# Patient Record
Sex: Female | Born: 1985 | Race: Black or African American | Hispanic: No | Marital: Single | Smoking: Never smoker
Health system: Southern US, Community
[De-identification: ages and names within clinical notes are randomized; demographics above are authoritative.]

## PROBLEM LIST (undated history)

## (undated) HISTORY — PX: TUBAL LIGATION: SHX77

---
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
## 2004-05-17 ENCOUNTER — Emergency Department: Payer: Self-pay | Admitting: Emergency Medicine

## 2005-05-10 ENCOUNTER — Emergency Department: Payer: Self-pay | Admitting: Emergency Medicine

## 2005-09-10 ENCOUNTER — Ambulatory Visit: Payer: Self-pay | Admitting: General Practice

## 2005-09-10 ENCOUNTER — Emergency Department: Payer: Self-pay | Admitting: General Practice

## 2005-11-09 ENCOUNTER — Emergency Department: Payer: Self-pay | Admitting: Emergency Medicine

## 2006-01-30 ENCOUNTER — Other Ambulatory Visit: Payer: Self-pay

## 2006-01-30 ENCOUNTER — Emergency Department: Payer: Self-pay | Admitting: Emergency Medicine

## 2006-03-11 ENCOUNTER — Observation Stay: Payer: Self-pay

## 2006-04-25 ENCOUNTER — Observation Stay: Payer: Self-pay | Admitting: Obstetrics and Gynecology

## 2006-04-28 ENCOUNTER — Inpatient Hospital Stay: Payer: Self-pay

## 2006-10-06 ENCOUNTER — Emergency Department: Payer: Self-pay | Admitting: Emergency Medicine

## 2008-01-05 IMAGING — US US OB < 14 WEEKS - US OB TV
1 series · 17 of 28 positions shown · non-contrast
Comparison: none

REASON FOR EXAM: pregnant w/llq pain
COMMENTS:

[Series 1: us ob < 14 weeks - us ob tv · 17 of 39 slices shown]
[im 1/39]
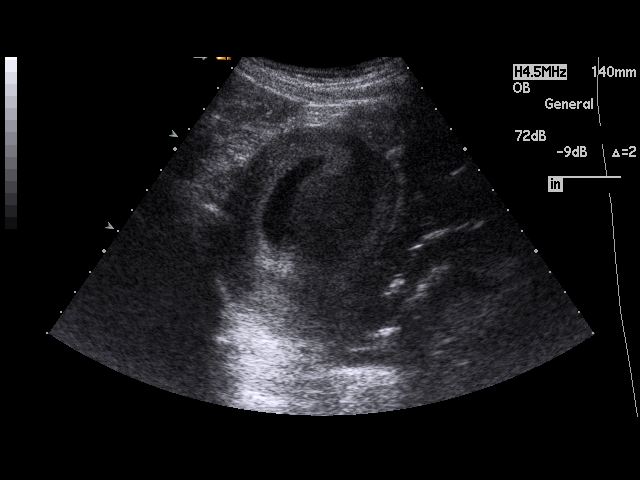
[im 3/39]
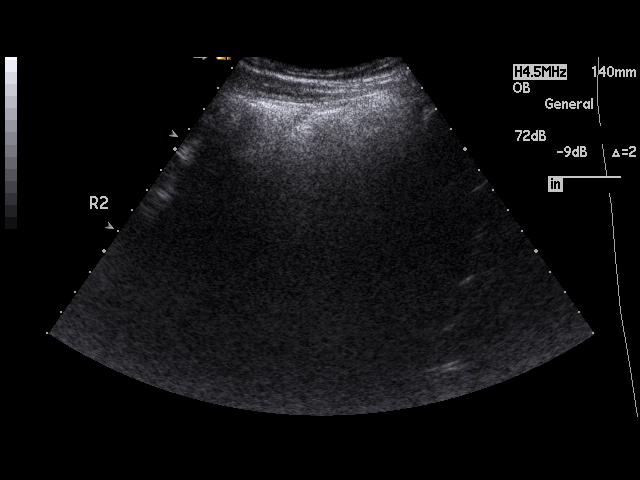
[im 6/39]
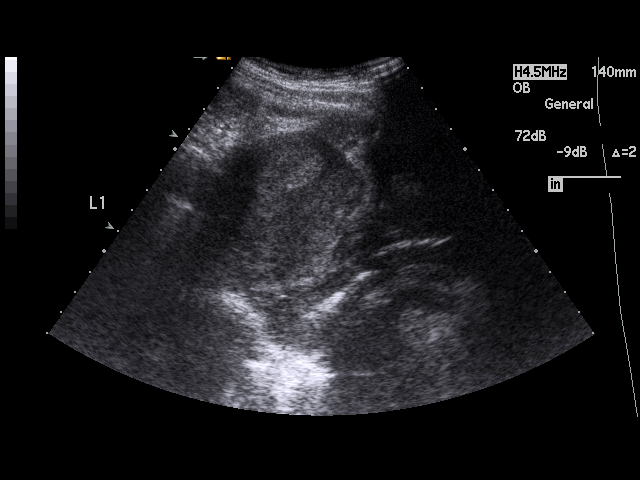
[im 8/39]
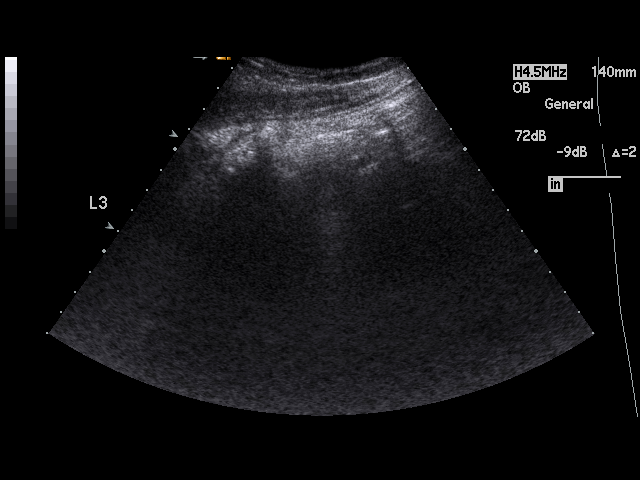
[im 10/39]
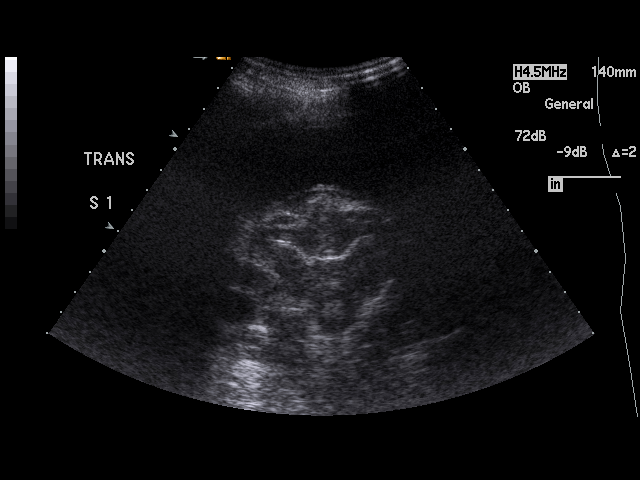
[im 13/39]
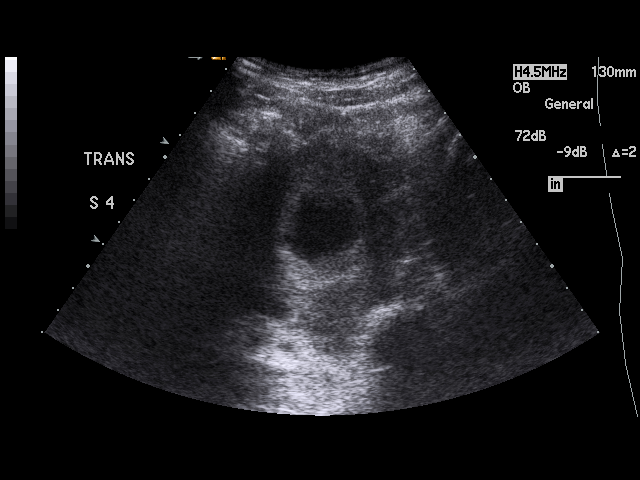
[im 15/39]
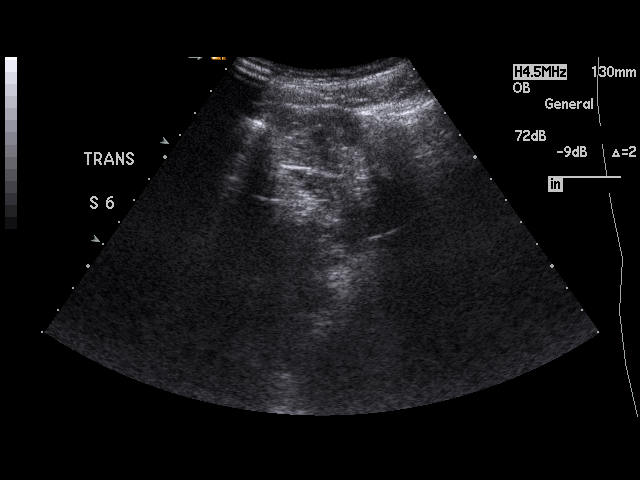
[im 17/39]
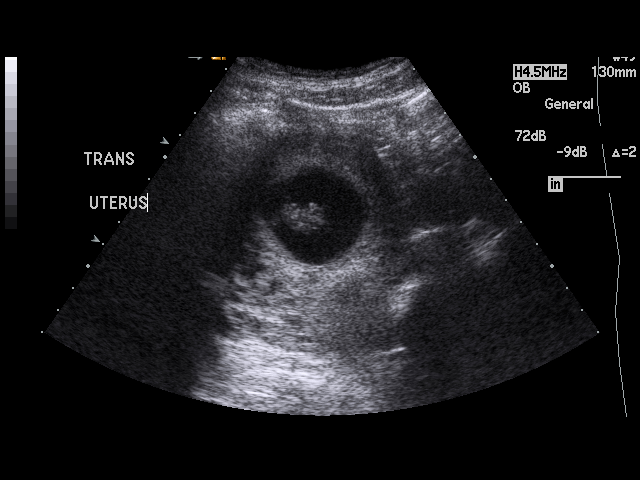
[im 20/39]
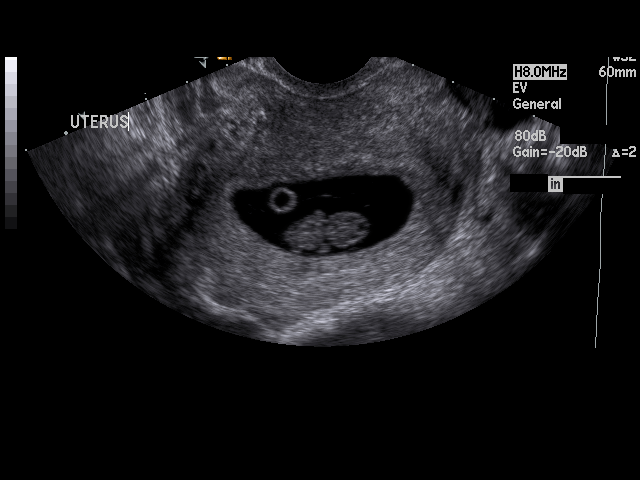
[im 22/39]
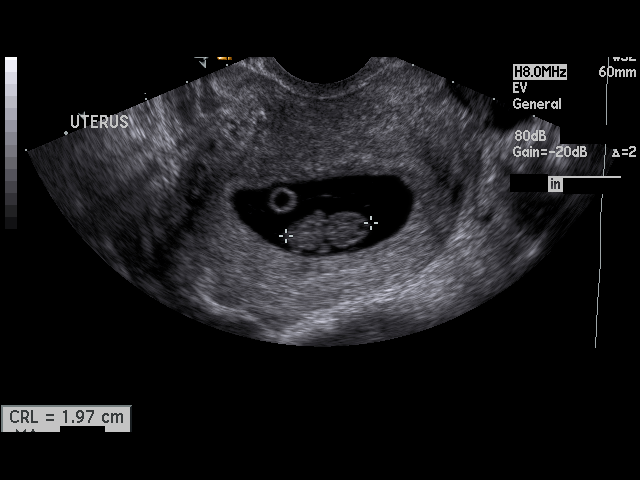
[im 24/39]
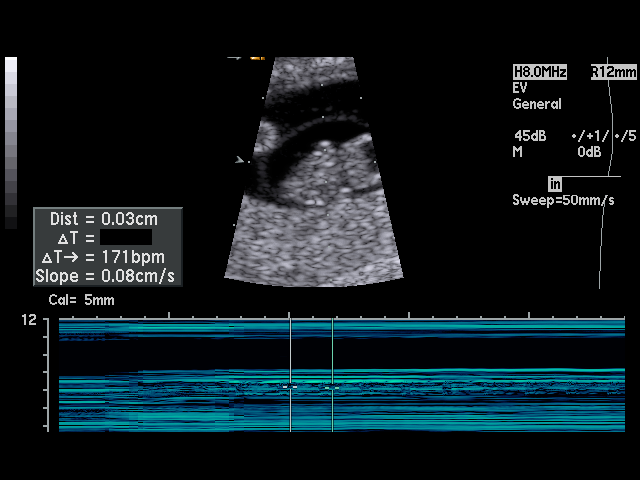
[im 26/39]
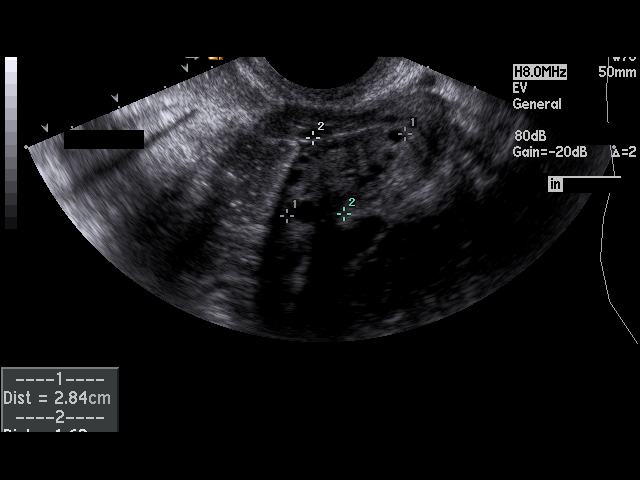
[im 29/39]
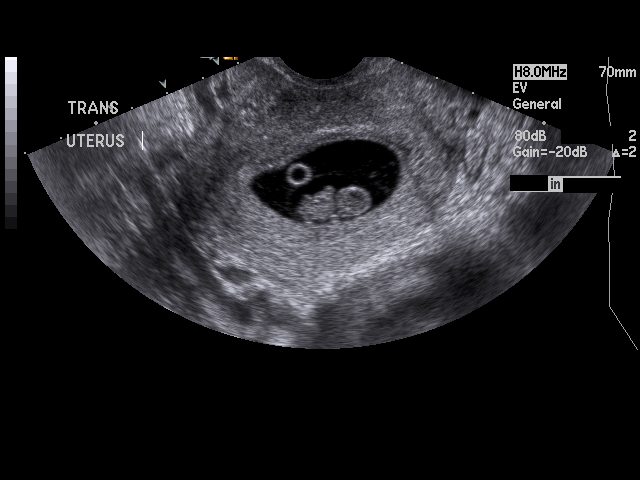
[im 31/39]
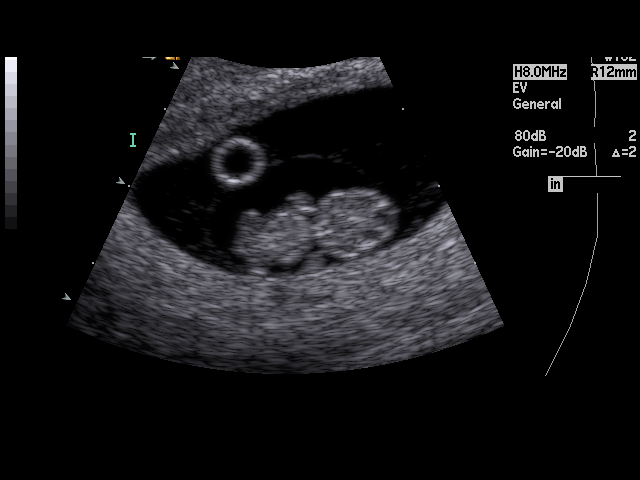
[im 33/39]
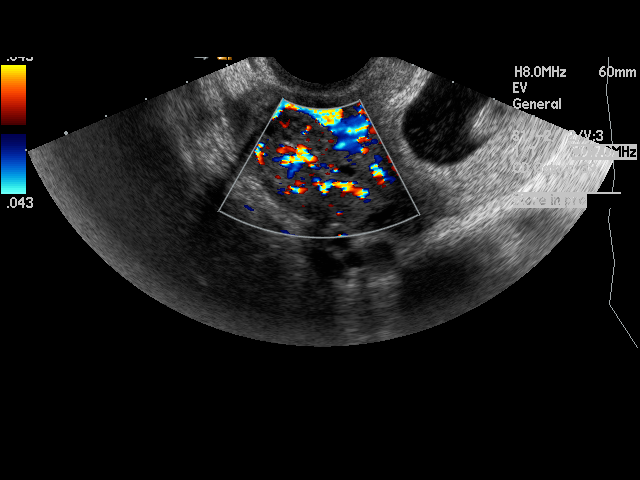
[im 36/39]
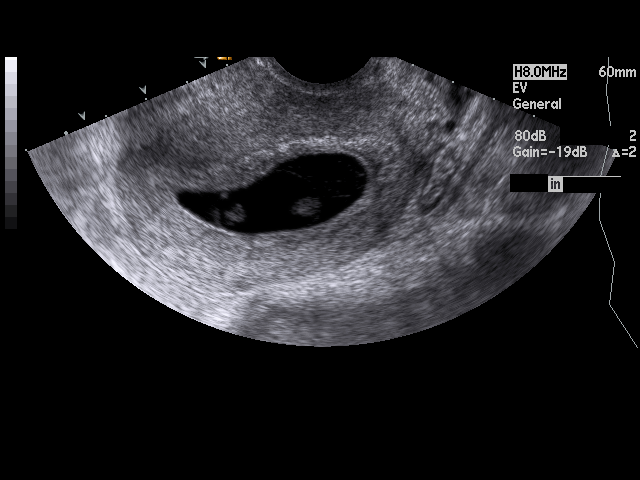
[im 39/39]
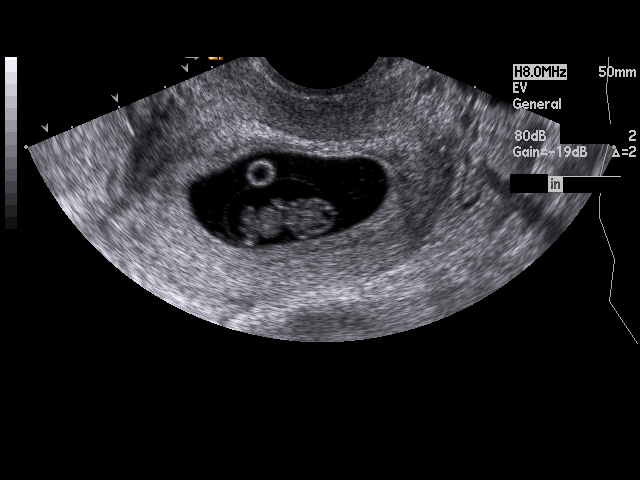

[17 of 28 positions shown; findings below may reference images not displayed]

PROCEDURE:     US  - US OB LESS THAN 14 WEEKS  - September 10, 2005 [DATE]

RESULT:          The patient is known to be in the first trimester of
pregnancy and has had several weeks of LEFT lower quadrant discomfort.

There is a viable intrauterine pregnancy with floating presentation.
Cardiac activity at the rate of 171 beats per minute was seen.  The placenta
is circumferential with no evidence of abruption.  The crown-rump length
measures an average of 1.96 cm, corresponding to an 8-weeks, 4 days
gestation + / - 5 days.  The estimated date of delivery is 18 April, 2006.

The maternal ovaries are normal in appearance.  There is no free fluid in
the cul-de-sac.
IMPRESSION: 1.     There is a viable intrauterine pregnancy of approximately 8 weeks, 4
days, corresponding to an estimated date of confinement 18 April, 2006.
2.     There is no evidence of placental abruption or subchorionic
hemorrhage.
3.     No maternal adnexal abnormalities are seen.

The findings were called to the emergency room at the conclusion of the
study.

## 2014-03-20 HISTORY — PX: WISDOM TOOTH EXTRACTION: SHX21

## 2019-04-22 ENCOUNTER — Emergency Department
Admission: EM | Admit: 2019-04-22 | Discharge: 2019-04-22 | Disposition: A | Discharge status: Home / Self Care | Payer: Managed Care, Other (non HMO) | Attending: Emergency Medicine | Admitting: Emergency Medicine

## 2019-04-22 ENCOUNTER — Emergency Department: Payer: Managed Care, Other (non HMO)

## 2019-04-22 ENCOUNTER — Other Ambulatory Visit: Payer: Self-pay

## 2019-04-22 ENCOUNTER — Encounter: Payer: Self-pay | Admitting: Emergency Medicine

## 2019-04-22 DIAGNOSIS — N939 Abnormal uterine and vaginal bleeding, unspecified: Secondary | ICD-10-CM | POA: Insufficient documentation

## 2019-04-22 DIAGNOSIS — N938 Other specified abnormal uterine and vaginal bleeding: Secondary | ICD-10-CM

## 2019-04-22 LAB — CBC
HCT: 42.6 % (ref 36.0–46.0)
Hemoglobin: 13.7 g/dL (ref 12.0–15.0)
MCH: 28.2 pg (ref 26.0–34.0)
MCHC: 32.2 g/dL (ref 30.0–36.0)
MCV: 87.7 fL (ref 80.0–100.0)
Platelets: 364 10*3/uL (ref 150–400)
RBC: 4.86 MIL/uL (ref 3.87–5.11)
RDW: 13.4 % (ref 11.5–15.5)
WBC: 9.4 10*3/uL (ref 4.0–10.5)
nRBC: 0 % (ref 0.0–0.2)

## 2019-04-22 LAB — BASIC METABOLIC PANEL
Anion gap: 9 (ref 5–15)
BUN: 12 mg/dL (ref 6–20)
CO2: 25 mmol/L (ref 22–32)
Calcium: 9.2 mg/dL (ref 8.9–10.3)
Chloride: 105 mmol/L (ref 98–111)
Creatinine, Ser: 0.85 mg/dL (ref 0.44–1.00)
GFR calc Af Amer: >60 mL/min (ref >60)
GFR calc non Af Amer: >60 mL/min (ref >60)
Glucose, Bld: 104 mg/dL — ABNORMAL HIGH (ref 70–99)
Potassium: 3.7 mmol/L (ref 3.5–5.1)
Sodium: 139 mmol/L (ref 135–145)

## 2019-04-22 LAB — POCT PREGNANCY, URINE: Preg Test, Ur: NEGATIVE

## 2019-04-22 NOTE — ED Notes (Signed)
Pt reports waking up in a puddle of blood this am.  No abd pain or cramping.  No urinary sx.  Last menses 10 days ago.  Pt reports having a tubal ligation.   Pt states she is on her 4th pad since 6am today.  Pt alert  Speech clear.  Pt ambulates without diff.

## 2019-04-22 NOTE — ED Triage Notes (Addendum)
Pt here for vaginal bleeding.  Denies any pain.  Reports had her period 04/07/19.  Tubes are tied.  4 pads today.  Ambulatory. NAD.  No other symptoms other than bleeding.  Period like clock work per pt.  Reports waking up in puddle of blood this AM.

## 2019-04-22 NOTE — ED Provider Notes (Signed)
[facility] Emergency Department Provider Note  Time seen: 3:56 PM  I have reviewed the triage vital signs and the nursing notes.   HISTORY  Chief Complaint Vaginal Bleeding   HPI Nichole Bowen is a 34 y.o. female with no significant past medical history presents to the emergency department for vaginal bleeding.  According to the patient she had a menstrual period roughly 2 weeks ago and has since restarted bleeding.  Patient states she has never had breakthrough bleeding previously.  Patient was concerned because in 2017 they found abnormal cervical cells requiring a colposcopy which ultimately ruled out to be negative per patient.  Patient was also told at the time she had uterine fibroids but is never had breakthrough bleeding.  Patient is not on any birth control but had a tubal ligation in 2008.  Denies any lightheadedness or dizziness.   History reviewed. No pertinent past medical history.  There are no problems to display for this patient.   Past Surgical History:  Procedure Laterality Date  . TUBAL LIGATION      Prior to Admission medications   Not on File    No Known Allergies  History reviewed. No pertinent family history.  Social History Social History   Tobacco Use  . Smoking status: Never Smoker  . Smokeless tobacco: Never Used  Substance Use Topics  . Alcohol use: Yes  . Drug use: Never    Review of Systems Constitutional: Negative for fever. Cardiovascular: Negative for chest pain. Respiratory: Negative for shortness of breath. Gastrointestinal: Negative for abdominal pain Genitourinary: Negative for urinary compaints.  Positive for vaginal bleeding. Musculoskeletal: Negative for musculoskeletal complaints Neurological: Negative for headache All other ROS negative  ____________________________________________   PHYSICAL EXAM:  VITAL SIGNS: ED Triage Vitals [04/22/19 1502]  Enc Vitals Group     BP 119/83   Pulse Rate 73     Resp 16     Temp 98.6 F (37 C)     Temp Source Oral     SpO2 98 %     Weight 154 lb (69.9 kg)     Height 5\' 4"  (1.626 m)     Head Circumference      Peak Flow      Pain Score 0     Pain Loc      Pain Edu?      Excl. in [place]?     Constitutional: Alert and oriented. Well appearing and in no distress. Eyes: Normal exam ENT      Head: Normocephalic and atraumatic.      Mouth/Throat: Mucous membranes are moist. Cardiovascular: Normal rate, regular rhythm. Respiratory: Normal respiratory effort without tachypnea nor retractions. Breath sounds are clear  Gastrointestinal: Soft and nontender. No distention.  Benign abdominal exam. Musculoskeletal: Nontender with normal range of motion in all extremities.  Neurologic:  Normal speech and language. No gross focal neurologic deficits  Skin:  Skin is warm, dry and intact.  Psychiatric: Mood and affect are normal.   ____________________________________________    RADIOLOGY  Normal-appearing uterus and adnexa.  ____________________________________________   INITIAL IMPRESSION / ASSESSMENT AND PLAN / ED COURSE  Pertinent labs & imaging results that were available during my care of the patient were reviewed by me and considered in my medical decision making (see chart for details).   Patient presents emergency department for vaginal bleeding.  Differential would include dysfunctional uterine bleeding.  Pregnancy test is negative.  Lab work is thus far reassuring including normal H&H.  We will proceed with a pelvic ultrasound to further evaluate.  Patient agreeable to plan of care.  Patient does follow-up with an OB in [place].  Ultrasound is normal.  We will discharge the patient home with OB/GYN follow-up.  Patient agreeable to plan of care.  Nichole Bowen was evaluated in Emergency Department on 04/22/2019 for the symptoms described in the history of present illness. She was evaluated in the context of the global  COVID-19 pandemic, which necessitated consideration that the patient might be at risk for infection with the SARS-CoV-2 virus that causes COVID-19. Institutional protocols and algorithms that pertain to the evaluation of patients at risk for COVID-19 are in a state of rapid change based on information released by regulatory bodies including the CDC and federal and state organizations. These policies and algorithms were followed during the patient's care in the ED.  ____________________________________________   FINAL CLINICAL IMPRESSION(S) / ED DIAGNOSES  Dysfunctional uterine bleeding   Harvest Dark, MD 04/22/19 [id number]

## 2020-03-20 DIAGNOSIS — A599 Trichomoniasis, unspecified: Secondary | ICD-10-CM

## 2020-03-20 HISTORY — DX: Trichomoniasis, unspecified: A59.9

## 2020-08-04 ENCOUNTER — Ambulatory Visit: Payer: Self-pay | Admitting: Physician Assistant

## 2020-08-04 ENCOUNTER — Other Ambulatory Visit: Payer: Self-pay

## 2020-08-04 DIAGNOSIS — Z113 Encounter for screening for infections with a predominantly sexual mode of transmission: Secondary | ICD-10-CM

## 2020-08-04 DIAGNOSIS — A5901 Trichomonal vulvovaginitis: Secondary | ICD-10-CM

## 2020-08-04 LAB — WET PREP FOR TRICH, YEAST, CLUE
Trichomonas Exam: POSITIVE — AB
Yeast Exam: NEGATIVE

## 2020-08-04 MED ORDER — METRONIDAZOLE 500 MG PO TABS: 500.0000 mg | ORAL_TABLET | Freq: Two times a day (BID) | ORAL | 0 refills | Status: AC

## 2020-08-04 NOTE — Progress Notes (Signed)
Wet mount reviewed by provider, pt treated for trich per provider orders. Provider orders completed.

## 2020-08-05 ENCOUNTER — Encounter: Payer: Self-pay | Admitting: Physician Assistant

## 2020-08-05 NOTE — Progress Notes (Signed)
[facility] Department STI clinic/screening visit  Subjective:  Nichole Bowen is a 35 y.o. female being seen today for an STI screening visit. The patient reports they do have symptoms.  Patient reports that they do not desire a pregnancy in the next year.   They reported they are not interested in discussing contraception today.  No LMP recorded.   Patient has the following medical conditions:  There are no problems to display for this patient.   Chief Complaint  Patient presents with  . SEXUALLY TRANSMITTED DISEASE    screening     HPI  Patient reports that it "feels different" in her vaginal area for a few days a couple of weeks ago.  States symptoms have resolved but she would like a screening today.  Reports last HIV test was in 2021 and last pap was 18 months ago.  States LMP was 07/11/2020 and has had a BTL for BCM.   See flowsheet for further details and programmatic requirements.    The following portions of the patient's history were reviewed and updated as appropriate: allergies, current medications, past medical history, past social history, past surgical history and problem list.  Objective:  There were no vitals filed for this visit.  Physical Exam Constitutional:      General: She is not in acute distress.    Appearance: Normal appearance.  HENT:     Head: Normocephalic and atraumatic.     Comments: No nits,lice, or hair loss. No cervical, supraclavicular or axillary adenopathy.    Mouth/Throat:     Mouth: Mucous membranes are moist.     Pharynx: Oropharynx is clear. No oropharyngeal exudate or posterior oropharyngeal erythema.  Eyes:     Conjunctiva/sclera: Conjunctivae normal.  Pulmonary:     Effort: Pulmonary effort is normal.  Abdominal:     Palpations: Abdomen is soft. There is no mass.     Tenderness: There is no abdominal tenderness. There is no guarding or rebound.  Genitourinary:    General: Normal vulva.     Rectum: Normal.      Comments: External genitalia/pubic area without nits, lice, edema, erythema, lesions and inguinal adenopathy. Vagina with normal mucosa andmoderate amount of thick, yellow/green discharge. Cervix without visible lesions. Uterus firm, mobile, nt, no masses, no CMT, no adnexal tenderness or fullness. Musculoskeletal:     Cervical back: Neck supple. No tenderness.  Skin:    General: Skin is warm and dry.     Findings: No bruising, erythema, lesion or rash.  Neurological:     Mental Status: She is alert and oriented to person, place, and time.  Psychiatric:        Mood and Affect: Mood normal.        Behavior: Behavior normal.        Thought Content: Thought content normal.        Judgment: Judgment normal.      Assessment and Plan:  Nichole Bowen is a 35 y.o. female presenting to the [facility] Department for STI screening  1. Screening for STD (sexually transmitted disease) Patient into clinic with symptoms. Rec condoms with all sex. Await test results.  Counseled that RN will call if needs to RTC for treatment once results are back. - WET PREP FOR [place], YEAST, CLUE - Chlamydia/Gonorrhea [place] [facility] - HIV [place] [facility] - Syphilis Serology, [facility]  2. Trichomonal vulvovaginitis Treat for Trich with Metronidazole 500 mg #14 1 po BID for 7  days with food, no EtOH for 24 hr before and until 72 hr after completing medicine. No sex for 14 days and until after partner completes treatment. Enc to use OTC antifungal cream if has itching during or just after treatment with antibiotic. - metroNIDAZOLE (FLAGYL) 500 MG tablet; Take 1 tablet (500 mg total) by mouth 2 (two) times daily for 7 days.  Dispense: 14 tablet; Refill: 0     Return in about 3 months (around 11/04/2020) for TOC.  No future appointments.  Nichole Dilling, PA

## 2020-08-11 LAB — HM HIV SCREENING LAB: HM HIV Screening: NEGATIVE

## 2021-05-05 ENCOUNTER — Ambulatory Visit: Payer: Managed Care, Other (non HMO)

## 2021-08-16 IMAGING — US US PELVIS COMPLETE WITH TRANSVAGINAL
1 series · 14 of 25 positions shown · non-contrast
Comparison: None

CLINICAL DATA: Bleeding



[Series 1: us pelvis complete with transvaginal · 14 of 101 slices shown]
[im 1/101]
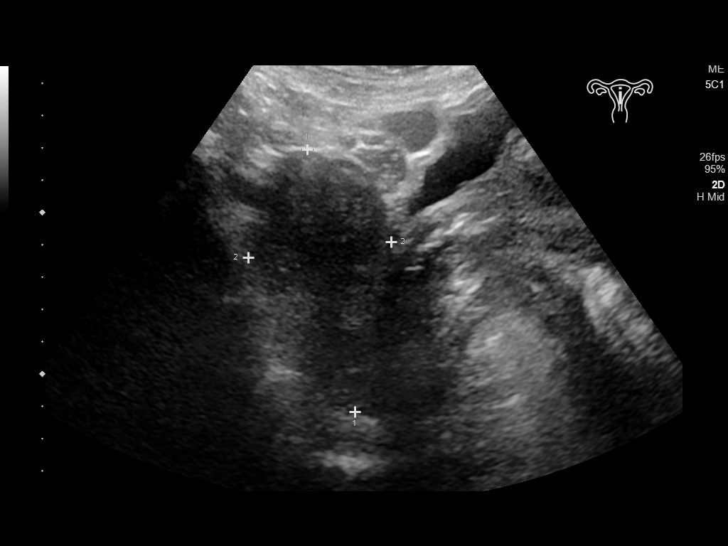
[im 9/101]
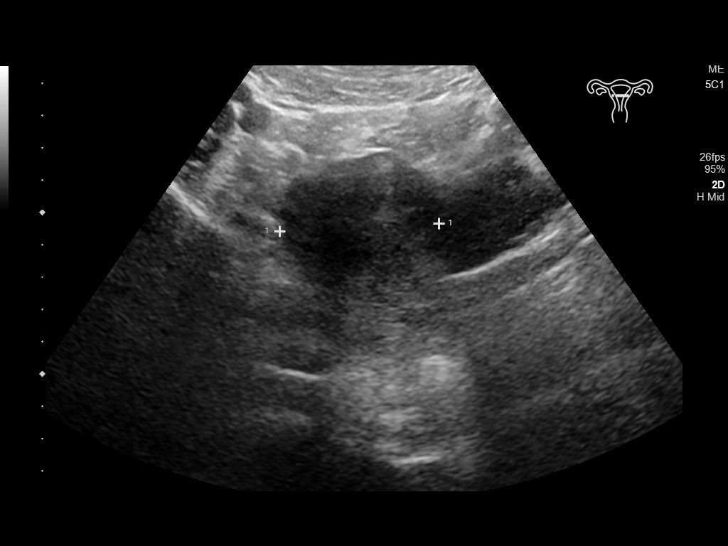
[im 17/101]
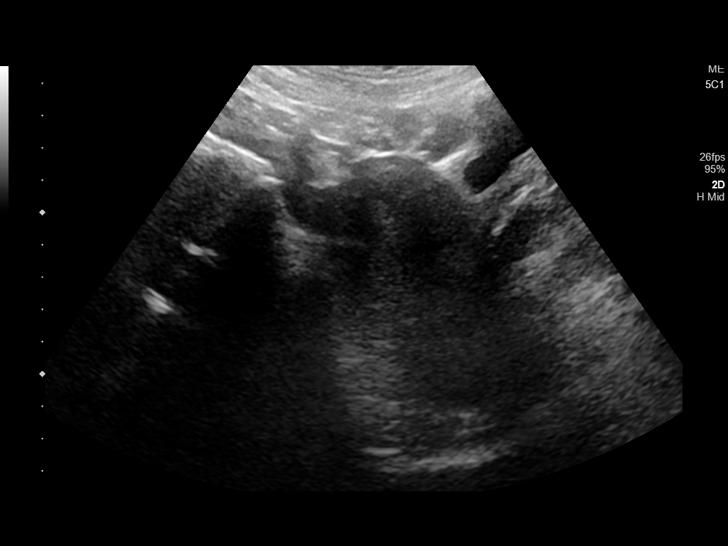
[im 26/101]
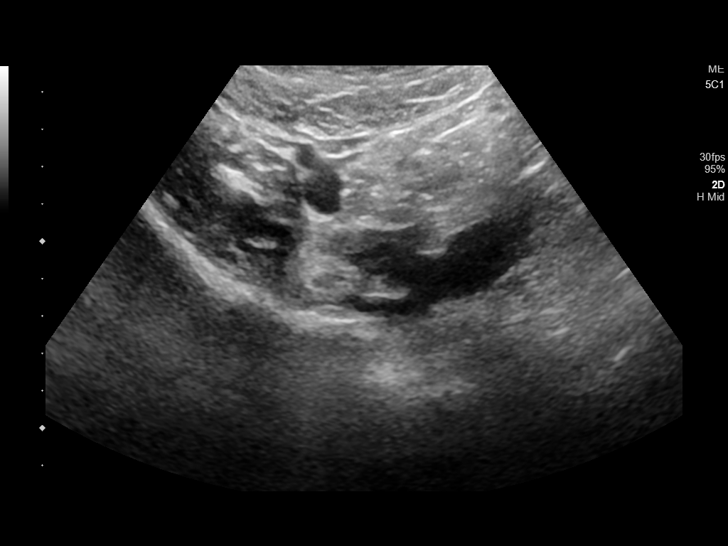
[im 34/101]
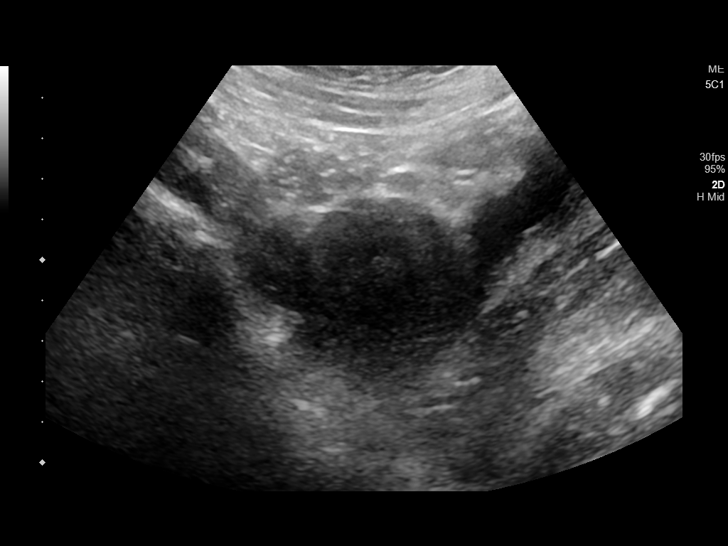
[im 38/101]
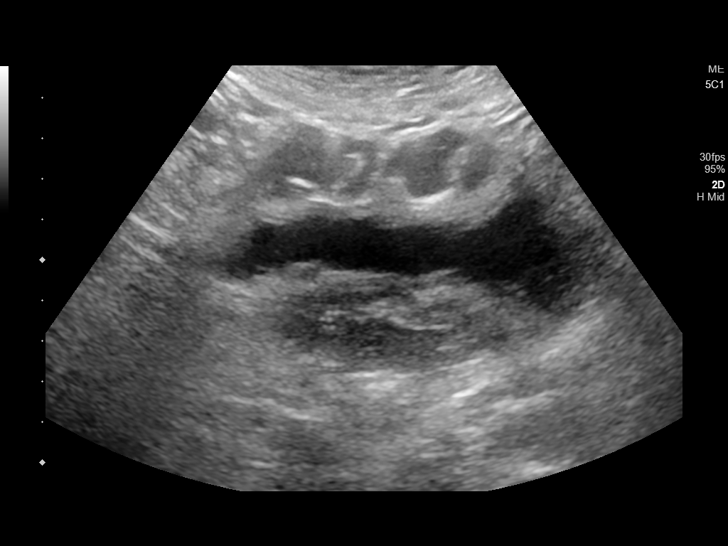
[im 46/101]
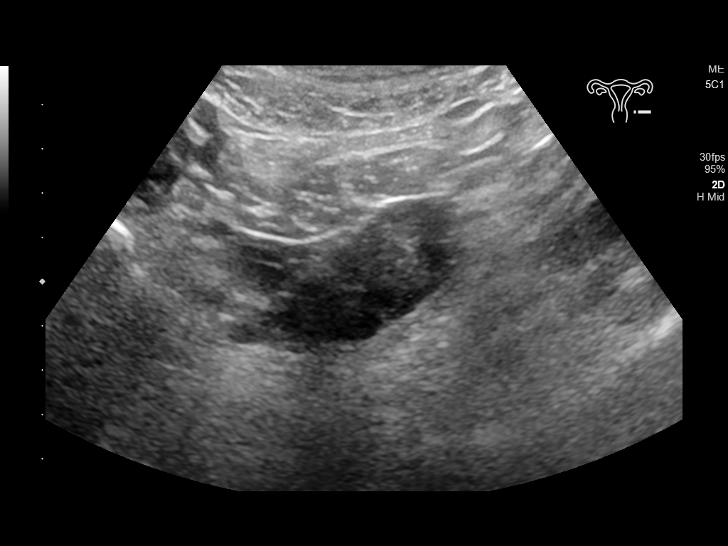
[im 55/101]
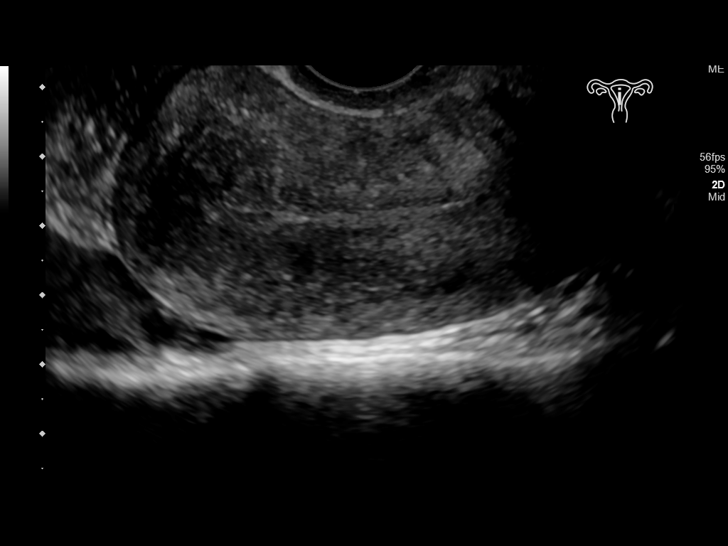
[im 63/101]
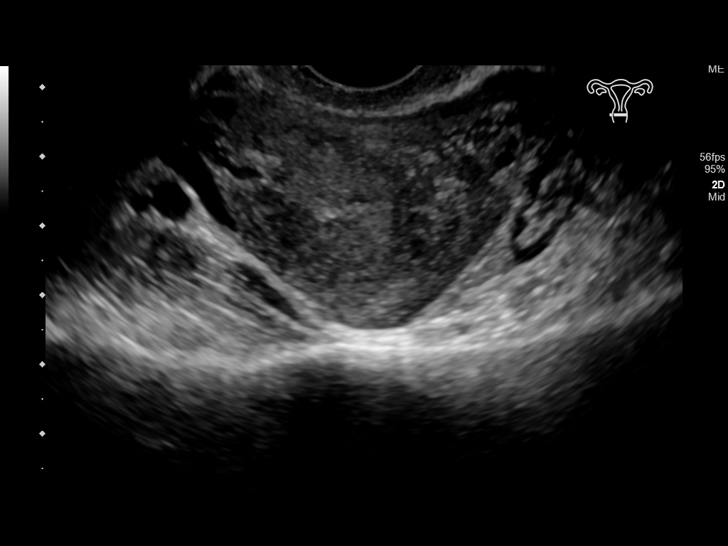
[im 67/101]
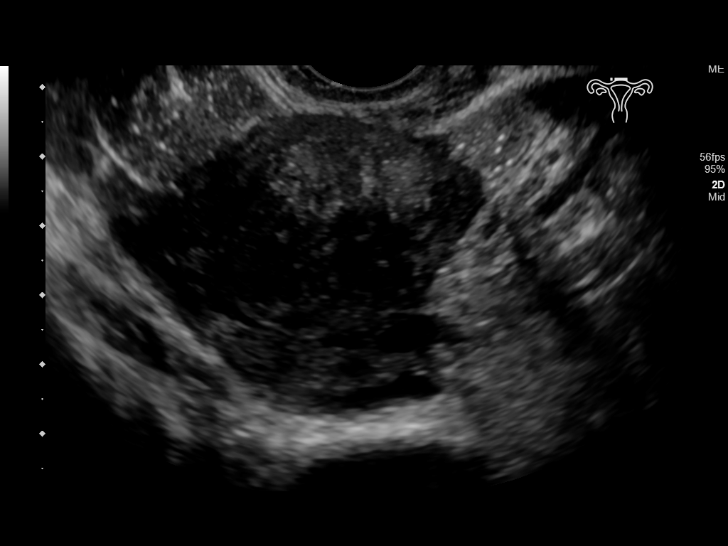
[im 76/101]
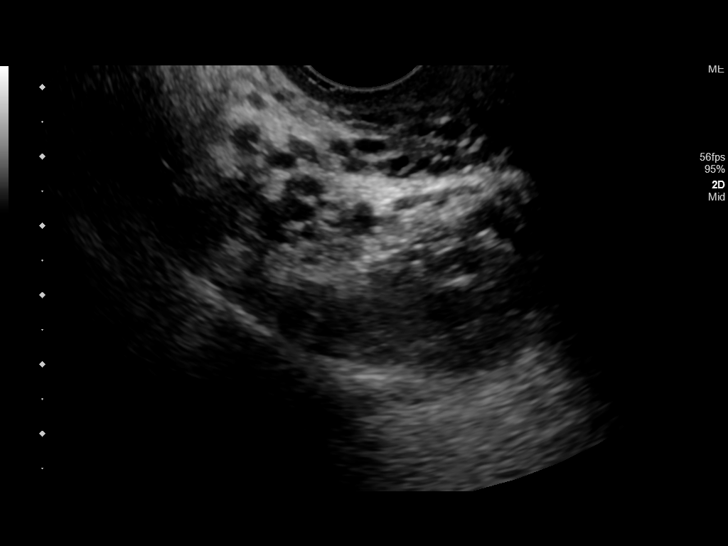
[im 84/101]
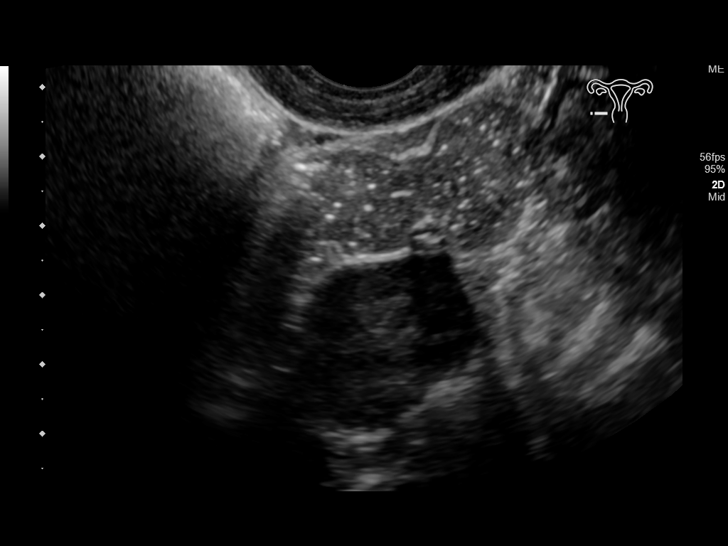
[im 92/101]
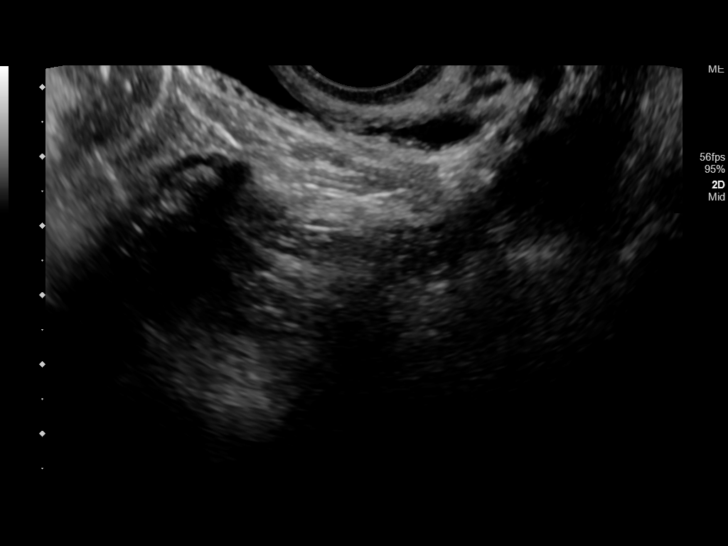
[im 101/101]
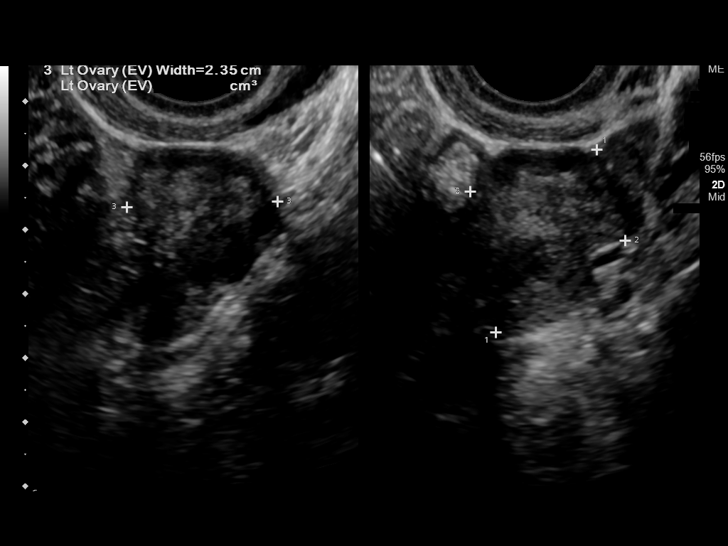

[14 of 25 positions shown; findings below may reference images not displayed]

FINDINGS: Uterus

Measurements: 7.2 x 4.0 x 4.5 cm = volume: 67 mL. No fibroids or
other mass visualized.

Endometrium

Thickness: 3.1 mm.  No focal abnormality visualized.

Right ovary

Measurements: 2.6 x 3.1 x 2.3 cm = volume: 9.9 mL. Normal
appearance/no adnexal mass.

Left ovary

Measurements: 3.3 x 2.5 x 2.4 cm = volume: 10.1 mL. Normal
appearance/no adnexal mass.

Other findings

Trace free fluid in the cul-de-sac.
IMPRESSION: Normal appearing uterus and adnexa.

## 2022-04-20 DIAGNOSIS — N871 Moderate cervical dysplasia: Secondary | ICD-10-CM

## 2022-04-20 HISTORY — PX: OTHER SURGICAL HISTORY: SHX169

## 2022-04-20 HISTORY — DX: Moderate cervical dysplasia: N87.1

## 2022-04-21 ENCOUNTER — Other Ambulatory Visit: Payer: Self-pay | Admitting: Obstetrics and Gynecology

## 2022-05-01 NOTE — H&P (Addendum)
Nichole Bowen is a 37 y.o. female here for Discuss results .   Pt returns to discuss her colposcopic biopsies  Done on 04/04/22: Comment: Part A-Cervical Biopsy,6:00: HIGH-GRADE SQUAMOUS INTRAEPITHELIAL LESION (CIN 2). ACUTE AND CHRONIC CERVICITIS WITH SQUAMOUS METAPLASIA. Part B-Cervical Biopsy,12:00: ACUTE AND CHRONIC CERVICITIS WITH SQUAMOUS METAPLASIA. Part C-Endocervical Curettings: HIGH-GRADE SQUAMOUS INTRAEPITHELIAL LESION (CIN 2). ACUTE AND CHRONIC ENDOCERVICITIS WITH SQUAMOUS METAPLASIA  H/o + HR HPV   Also with + BV and Trichomonas . She and her partner were both treated    Past Medical History:  has a past medical history of History of abnormal cervical Pap smear (2016) and trichomoniasis (2022).  Past Surgical History:  has a past surgical history that includes Laparoscopic tubal ligation and colposcopy (2017). Family History: family history includes Breast cancer in her maternal aunt; Diabetes in her mother. Social History:  reports that she has never smoked. She has never used smokeless tobacco. She reports current alcohol use. She reports that she does not use drugs. OB/GYN History:  OB History       Gravida  3   Para  3   Term  3   Preterm      AB      Living  3        SAB      IAB      Ectopic      Molar      Multiple      Live Births  3             Allergies: is allergic to aspirin. Medications:   Current Outpatient Medications:    cyclobenzaprine (FLEXERIL) 10 MG tablet, Take 1 tablet (10 mg total) by mouth 3 (three) times daily as needed for Muscle spasms (Patient not taking: Reported on 02/23/2022), Disp: 15 tablet, Rfl: 0   ibuprofen (ADVIL,MOTRIN) 600 MG tablet, Take 1 tablet (600 mg total) by mouth every 6 (six) hours as needed for Pain (Patient not taking: Reported on 02/23/2022), Disp: 30 tablet, Rfl: 0   Review of Systems: General:                      No fatigue or weight loss Eyes:                           No vision  changes Ears:                            No hearing difficulty Respiratory:                No cough or shortness of breath Pulmonary:                  No asthma or shortness of breath Cardiovascular:           No chest pain, palpitations, dyspnea on exertion Gastrointestinal:          No abdominal bloating, chronic diarrhea, constipations, masses, pain or hematochezia Genitourinary:             No hematuria, dysuria, abnormal vaginal discharge, pelvic pain, Menometrorrhagia Lymphatic:                   No swollen lymph nodes Musculoskeletal:         No muscle weakness Neurologic:  No extremity weakness, syncope, seizure disorder Psychiatric:                  No history of depression, delusions or suicidal/homicidal ideation      Exam:       Vitals:    05/02/22  BP: 117/78  Pulse: 87      Body mass index is 26.78 kg/m.   WDWN / black female in NAD   Lungs: CTA  CV : RRR without murmur   Breast: exam done in sitting and lying position : No dimpling or retraction, no dominant mass, no spontaneous discharge, no axillary adenopathy Neck:  no thyromegaly Abdomen: soft , no mass, normal active bowel sounds,  non-tender, no rebound tenderness Pelvic: tanner stage 5 ,  External genitalia: vulva /labia no lesions Urethra: no prolapse Vagina: normal physiologic d/c, adequate room for TVH if need be   Cervix:  deep posterior no lesions, no cervical motion tenderness   Uterus: normal size shape and contour, non-tender Adnexa: no mass,  non-tender   Rectovaginal:    Impression:    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   + ecc     Plan:    Spoke to the patient about options for treatment ;   Cervical LEEP with " hat " and ecc   Vs definitive tx with TVH and bilateral salpingectomy   After pros and cons discussed she has elected for the [facility]        I have explained the risks - see [place] notes         Caroline Sauger,  MD

## 2022-05-09 ENCOUNTER — Encounter
Admission: RE | Admit: 2022-05-09 | Discharge: 2022-05-09 | Disposition: A | Discharge status: Home / Self Care | Payer: Managed Care, Other (non HMO) | Source: Ambulatory Visit | Attending: Obstetrics and Gynecology | Admitting: Obstetrics and Gynecology

## 2022-05-09 ENCOUNTER — Other Ambulatory Visit: Payer: Self-pay

## 2022-05-09 DIAGNOSIS — Z01818 Encounter for other preprocedural examination: Secondary | ICD-10-CM

## 2022-05-09 DIAGNOSIS — Z01812 Encounter for preprocedural laboratory examination: Secondary | ICD-10-CM

## 2022-05-09 LAB — CBC
HCT: 39.9 % (ref 36.0–46.0)
Hemoglobin: 12.8 g/dL (ref 12.0–15.0)
MCH: 27.9 pg (ref 26.0–34.0)
MCHC: 32.1 g/dL (ref 30.0–36.0)
MCV: 87.1 fL (ref 80.0–100.0)
Platelets: 332 10*3/uL (ref 150–400)
RBC: 4.58 MIL/uL (ref 3.87–5.11)
RDW: 14 % (ref 11.5–15.5)
WBC: 7.4 10*3/uL (ref 4.0–10.5)
nRBC: 0 % (ref 0.0–0.2)

## 2022-05-09 LAB — BASIC METABOLIC PANEL
Anion gap: 9 (ref 5–15)
BUN: 8 mg/dL (ref 6–20)
CO2: 25 mmol/L (ref 22–32)
Calcium: 8.7 mg/dL — ABNORMAL LOW (ref 8.9–10.3)
Chloride: 105 mmol/L (ref 98–111)
Creatinine, Ser: 0.72 mg/dL (ref 0.44–1.00)
GFR, Estimated: >60 mL/min (ref >60)
Glucose, Bld: 95 mg/dL (ref 70–99)
Potassium: 3.8 mmol/L (ref 3.5–5.1)
Sodium: 139 mmol/L (ref 135–145)

## 2022-05-09 LAB — TYPE AND SCREEN
ABO/RH(D): O POS
Antibody Screen: NEGATIVE

## 2022-05-09 NOTE — Patient Instructions (Addendum)
Your procedure is scheduled on: 05/18/22 - Thursday Report to the Registration Desk on the 1st floor of the [facility]. To find out your arrival time, please call [phone] between 1PM - 3PM on: 05/17/22 - Wednesday If your arrival time is 6:00 am, do not arrive before that time as the [facility] entrance doors do not open until 6:00 am.  REMEMBER: Instructions that are not followed completely may result in serious medical risk, up to and including death; or upon the discretion of your surgeon and anesthesiologist your surgery may need to be rescheduled.  Do not eat food after midnight the night before surgery.  No gum chewing or hard candies.  You may however, drink CLEAR liquids up to 2 hours before you are scheduled to arrive for your surgery. Do not drink anything within 2 hours of your scheduled arrival time.  Clear liquids include: - water  - apple juice without pulp - gatorade (not RED colors) - black coffee or tea (Do NOT add milk or creamers to the coffee or tea) Do NOT drink anything that is not on this list.  In addition, your doctor has ordered for you to drink the provided:  Ensure Pre-Surgery Clear Carbohydrate Drink  Drinking this carbohydrate drink up to two hours before surgery helps to reduce insulin resistance and improve patient outcomes. Please complete drinking 2 hours before scheduled arrival time.  One week prior to surgery: Stop Anti-inflammatories (NSAIDS) such as Advil, Aleve, Ibuprofen, Motrin, Naproxen, Naprosyn and Aspirin based products such as Excedrin, Goody's Powder, BC Powder.  Stop ANY OVER THE COUNTER supplements until after surgery.  You may take Tylenol if needed for pain up until the day of surgery.  TAKE ONLY THESE MEDICATIONS THE MORNING OF SURGERY WITH A SIP OF WATER:  NONE   No Alcohol for 24 hours before or after surgery.  No Smoking including e-cigarettes for 24 hours before surgery.  No chewable tobacco products for at  least 6 hours before surgery.  No nicotine patches on the day of surgery.  Do not use any "recreational" drugs for at least a week (preferably 2 weeks) before your surgery.  Please be advised that the combination of cocaine and anesthesia may have negative outcomes, up to and including death. If you test positive for cocaine, your surgery will be cancelled.  On the morning of surgery brush your teeth with toothpaste and water, you may rinse your mouth with mouthwash if you wish. Do not swallow any toothpaste or mouthwash.  Use CHG Soap or wipes as directed on instruction sheet.  Do not wear jewelry, make-up, hairpins, clips or nail polish.  Do not wear lotions, powders, or perfumes.   Do not shave body hair from the neck down 48 hours before surgery.  Contact lenses, hearing aids and dentures may not be worn into surgery.  Do not bring valuables to the hospital. [facility] is not responsible for any missing/lost belongings or valuables.   Notify your doctor if there is any change in your medical condition (cold, fever, infection).  Wear comfortable clothing (specific to your surgery type) to the hospital.  After surgery, you can help prevent lung complications by doing breathing exercises.  Take deep breaths and cough every 1-2 hours. Your doctor may order a device called an Incentive Spirometer to help you take deep breaths. When coughing or sneezing, hold a pillow firmly against your incision with both hands. This is called "splinting." Doing this helps protect your incision. It  also decreases belly discomfort.  If you are being admitted to the hospital overnight, leave your suitcase in the car. After surgery it may be brought to your room.  In case of increased patient census, it may be necessary for you, the patient, to continue your postoperative care in the Same Day Surgery department.  If you are being discharged the day of surgery, you will not be allowed to drive home. You  will need a responsible individual to drive you home and stay with you for 24 hours after surgery.   If you are taking public transportation, you will need to have a responsible individual with you.  Please call the [facility]. at [phone] if you have any questions about these instructions.  Surgery Visitation Policy:  Patients undergoing a surgery or procedure may have two family members or support persons with them as long as the person is not COVID-19 positive or experiencing its symptoms.   Inpatient Visitation:    Visiting hours are 7 a.m. to 8 p.m. Up to four visitors are allowed at one time in a patient room. The visitors may rotate out with other people during the day. One designated support person (adult) may remain overnight.  Due to an increase in RSV and influenza rates and associated hospitalizations, children ages 58 and under will not be able to visit patients in [facility]. Masks continue to be strongly recommended.    Preparing for Surgery with CHLORHEXIDINE GLUCONATE (CHG) Soap  Chlorhexidine Gluconate (CHG) Soap  o An antiseptic cleaner that kills germs and bonds with the skin to continue killing germs even after washing  o Used for showering the night before surgery and morning of surgery  Before surgery, you can play an important role by reducing the number of germs on your skin.  CHG (Chlorhexidine gluconate) soap is an antiseptic cleanser which kills germs and bonds with the skin to continue killing germs even after washing.  Please do not use if you have an allergy to CHG or antibacterial soaps. If your skin becomes reddened/irritated stop using the CHG.  1. Shower the NIGHT BEFORE SURGERY and the MORNING OF SURGERY with CHG soap.  2. If you choose to wash your hair, wash your hair first as usual with your normal shampoo.  3. After shampooing, rinse your hair and body thoroughly to remove the shampoo.  4. Use CHG as you  would any other liquid soap. You can apply CHG directly to the skin and wash gently with a scrungie or a clean washcloth.  5. Apply the CHG soap to your body only from the neck down. Do not use on open wounds or open sores. Avoid contact with your eyes, ears, mouth, and genitals (private parts). Wash face and genitals (private parts) with your normal soap.  6. Wash thoroughly, paying special attention to the area where your surgery will be performed.  7. Thoroughly rinse your body with warm water.  8. Do not shower/wash with your normal soap after using and rinsing off the CHG soap.  9. Pat yourself dry with a clean towel.  10. Wear clean pajamas to bed the night before surgery.  12. Place clean sheets on your bed the night of your first shower and do not sleep with pets.  13. Shower again with the CHG soap on the day of surgery prior to arriving at the hospital.  14. Do not apply any deodorants/lotions/powders.  15. Please wear clean clothes to the hospital.  How to Use an Incentive Spirometer  An incentive spirometer is a tool that measures how well you are filling your lungs with each breath. Learning to take long, deep breaths using this tool can help you keep your lungs clear and active. This may help to reverse or lessen your chance of developing breathing (pulmonary) problems, especially infection. You may be asked to use a spirometer: After a surgery. If you have a lung problem or a history of smoking. After a long period of time when you have been unable to move or be active. If the spirometer includes an indicator to show the highest number that you have reached, your health care provider or respiratory therapist will help you set a goal. Keep a log of your progress as told by your health care provider. What are the risks? Breathing too quickly may cause dizziness or cause you to pass out. Take your time so you do not get dizzy or light-headed. If you are in pain, you may  need to take pain medicine before doing incentive spirometry. It is harder to take a deep breath if you are having pain. How to use your incentive spirometer  Sit up on the edge of your bed or on a chair. Hold the incentive spirometer so that it is in an upright position. Before you use the spirometer, breathe out normally. Place the mouthpiece in your mouth. Make sure your lips are closed tightly around it. Breathe in slowly and as deeply as you can through your mouth, causing the piston or the ball to rise toward the top of the chamber. Hold your breath for 3-5 seconds, or for as long as possible. If the spirometer includes a coach indicator, use this to guide you in breathing. Slow down your breathing if the indicator goes above the marked areas. Remove the mouthpiece from your mouth and breathe out normally. The piston or ball will return to the bottom of the chamber. Rest for a few seconds, then repeat the steps 10 or more times. Take your time and take a few normal breaths between deep breaths so that you do not get dizzy or light-headed. Do this every 1-2 hours when you are awake. If the spirometer includes a goal marker to show the highest number you have reached (best effort), use this as a goal to work toward during each repetition. After each set of 10 deep breaths, cough a few times. This will help to make sure that your lungs are clear. If you have an incision on your chest or abdomen from surgery, place a pillow or a rolled-up towel firmly against the incision when you cough. This can help to reduce pain while taking deep breaths and coughing. General tips When you are able to get out of bed: Walk around often. Continue to take deep breaths and cough in order to clear your lungs. Keep using the incentive spirometer until your health care provider says it is okay to stop using it. If you have been in the hospital, you may be told to keep using the spirometer at home. Contact a  health care provider if: You are having difficulty using the spirometer. You have trouble using the spirometer as often as instructed. Your pain medicine is not giving enough relief for you to use the spirometer as told. You have a fever. Get help right away if: You develop shortness of breath. You develop a cough with bloody mucus from the lungs. You have fluid or blood coming from  an incision site after you cough. Summary An incentive spirometer is a tool that can help you learn to take long, deep breaths to keep your lungs clear and active. You may be asked to use a spirometer after a surgery, if you have a lung problem or a history of smoking, or if you have been inactive for a long period of time. Use your incentive spirometer as instructed every 1-2 hours while you are awake. If you have an incision on your chest or abdomen, place a pillow or a rolled-up towel firmly against your incision when you cough. This will help to reduce pain. Get help right away if you have shortness of breath, you cough up bloody mucus, or blood comes from your incision when you cough. This information is not intended to replace advice given to you by your health care provider. Make sure you discuss any questions you have with your health care provider. Document Revised: 05/26/2019 Document Reviewed: 05/26/2019 Elsevier Patient Education  [place].

## 2022-05-18 ENCOUNTER — Other Ambulatory Visit: Payer: Self-pay

## 2022-05-18 ENCOUNTER — Ambulatory Visit: Payer: Managed Care, Other (non HMO) | Admitting: General Practice

## 2022-05-18 ENCOUNTER — Encounter
Admission: RE | Disposition: A | Discharge status: Home / Self Care | Payer: Self-pay | Source: Ambulatory Visit | Attending: Obstetrics and Gynecology

## 2022-05-18 ENCOUNTER — Encounter: Payer: Self-pay | Admitting: Obstetrics and Gynecology

## 2022-05-18 ENCOUNTER — Ambulatory Visit
Admission: RE | Admit: 2022-05-18 | Discharge: 2022-05-18 | Disposition: A | Discharge status: Home / Self Care | Payer: Managed Care, Other (non HMO) | Source: Ambulatory Visit | Attending: Obstetrics and Gynecology | Admitting: Obstetrics and Gynecology

## 2022-05-18 ENCOUNTER — Ambulatory Visit: Payer: Managed Care, Other (non HMO) | Admitting: Urgent Care

## 2022-05-18 DIAGNOSIS — N838 Other noninflammatory disorders of ovary, fallopian tube and broad ligament: Secondary | ICD-10-CM | POA: Insufficient documentation

## 2022-05-18 DIAGNOSIS — D252 Subserosal leiomyoma of uterus: Secondary | ICD-10-CM | POA: Insufficient documentation

## 2022-05-18 DIAGNOSIS — D251 Intramural leiomyoma of uterus: Secondary | ICD-10-CM | POA: Insufficient documentation

## 2022-05-18 DIAGNOSIS — Z833 Family history of diabetes mellitus: Secondary | ICD-10-CM | POA: Diagnosis not present

## 2022-05-18 DIAGNOSIS — Z01818 Encounter for other preprocedural examination: Secondary | ICD-10-CM

## 2022-05-18 DIAGNOSIS — N72 Inflammatory disease of cervix uteri: Secondary | ICD-10-CM | POA: Diagnosis not present

## 2022-05-18 DIAGNOSIS — N871 Moderate cervical dysplasia: Secondary | ICD-10-CM | POA: Diagnosis not present

## 2022-05-18 DIAGNOSIS — Z01812 Encounter for preprocedural laboratory examination: Secondary | ICD-10-CM

## 2022-05-18 DIAGNOSIS — Z803 Family history of malignant neoplasm of breast: Secondary | ICD-10-CM | POA: Insufficient documentation

## 2022-05-18 HISTORY — PX: VAGINAL HYSTERECTOMY: SHX2639

## 2022-05-18 LAB — POCT PREGNANCY, URINE: Preg Test, Ur: NEGATIVE

## 2022-05-18 LAB — ABO/RH: ABO/RH(D): O POS

## 2022-05-18 SURGERY — HYSTERECTOMY, VAGINAL
Anesthesia: General | Laterality: Bilateral

## 2022-05-18 MED ORDER — FENTANYL CITRATE (PF) 100 MCG/2ML IJ SOLN
INTRAMUSCULAR | Status: DC | PRN
  Administered 2022-05-18 (×2): 50 ug via INTRAVENOUS

## 2022-05-18 MED ORDER — HYDROMORPHONE HCL 1 MG/ML IJ SOLN
INTRAMUSCULAR | Status: DC | PRN
  Administered 2022-05-18 (×2): .5 mg via INTRAVENOUS

## 2022-05-18 MED ORDER — CEFAZOLIN SODIUM-DEXTROSE 2-4 GM/100ML-% IV SOLN
2.0000 g | Freq: Once | INTRAVENOUS | Status: AC
  Administered 2022-05-18: 2 g via INTRAVENOUS

## 2022-05-18 MED ORDER — MIDAZOLAM HCL 2 MG/2ML IJ SOLN
INTRAMUSCULAR | Status: AC
  Filled 2022-05-18: qty 2

## 2022-05-18 MED ORDER — CHLORHEXIDINE GLUCONATE 0.12 % MT SOLN
OROMUCOSAL | Status: AC
  Administered 2022-05-18: 15 mL via OROMUCOSAL
  Filled 2022-05-18: qty 15

## 2022-05-18 MED ORDER — ROCURONIUM BROMIDE 10 MG/ML (PF) SYRINGE
PREFILLED_SYRINGE | INTRAVENOUS | Status: AC
  Filled 2022-05-18: qty 10

## 2022-05-18 MED ORDER — LIDOCAINE-EPINEPHRINE 1 %-1:100000 IJ SOLN
INTRAMUSCULAR | Status: AC
  Filled 2022-05-18: qty 1

## 2022-05-18 MED ORDER — GABAPENTIN 300 MG PO CAPS: 300.0000 mg | ORAL_CAPSULE | ORAL | Status: AC

## 2022-05-18 MED ORDER — ORAL CARE MOUTH RINSE: 15.0000 mL | Freq: Once | OROMUCOSAL | Status: AC

## 2022-05-18 MED ORDER — GLYCOPYRROLATE 0.2 MG/ML IJ SOLN
INTRAMUSCULAR | Status: AC
  Filled 2022-05-18: qty 1

## 2022-05-18 MED ORDER — LIDOCAINE-EPINEPHRINE 1 %-1:100000 IJ SOLN
INTRAMUSCULAR | Status: DC | PRN
  Administered 2022-05-18: 9 mL

## 2022-05-18 MED ORDER — CEFAZOLIN SODIUM-DEXTROSE 2-4 GM/100ML-% IV SOLN
INTRAVENOUS | Status: AC
  Filled 2022-05-18: qty 100

## 2022-05-18 MED ORDER — MIDAZOLAM HCL 2 MG/2ML IJ SOLN
INTRAMUSCULAR | Status: DC | PRN
  Administered 2022-05-18: 2 mg via INTRAVENOUS

## 2022-05-18 MED ORDER — PROPOFOL 10 MG/ML IV BOLUS
INTRAVENOUS | Status: AC
  Filled 2022-05-18: qty 20

## 2022-05-18 MED ORDER — OXYCODONE HCL 5 MG PO TABS
ORAL_TABLET | ORAL | Status: AC
  Filled 2022-05-18: qty 1

## 2022-05-18 MED ORDER — 0.9 % SODIUM CHLORIDE (POUR BTL) OPTIME
TOPICAL | Status: DC | PRN
  Administered 2022-05-18: 10 mL

## 2022-05-18 MED ORDER — HYDROMORPHONE HCL 1 MG/ML IJ SOLN
INTRAMUSCULAR | Status: AC
  Filled 2022-05-18: qty 1

## 2022-05-18 MED ORDER — LACTATED RINGERS IV SOLN: INTRAVENOUS | Status: DC

## 2022-05-18 MED ORDER — GLYCOPYRROLATE 0.2 MG/ML IJ SOLN
INTRAMUSCULAR | Status: DC | PRN
  Administered 2022-05-18: .1 mg via INTRAVENOUS

## 2022-05-18 MED ORDER — METRONIDAZOLE 500 MG/100ML IV SOLN
500.0000 mg | Freq: Once | INTRAVENOUS | Status: AC
  Administered 2022-05-18: 500 mg via INTRAVENOUS
  Filled 2022-05-18: qty 100

## 2022-05-18 MED ORDER — DEXAMETHASONE SODIUM PHOSPHATE 10 MG/ML IJ SOLN
INTRAMUSCULAR | Status: AC
  Filled 2022-05-18: qty 1

## 2022-05-18 MED ORDER — ACETAMINOPHEN 500 MG PO TABS: 1000.0000 mg | ORAL_TABLET | ORAL | Status: AC

## 2022-05-18 MED ORDER — OXYCODONE HCL 5 MG PO TABS
5.0000 mg | ORAL_TABLET | ORAL | Status: DC | PRN
  Administered 2022-05-18: 5 mg via ORAL

## 2022-05-18 MED ORDER — LIDOCAINE HCL (PF) 2 % IJ SOLN
INTRAMUSCULAR | Status: AC
  Filled 2022-05-18: qty 5

## 2022-05-18 MED ORDER — ONDANSETRON HCL 4 MG/2ML IJ SOLN
INTRAMUSCULAR | Status: AC
  Filled 2022-05-18: qty 2

## 2022-05-18 MED ORDER — CHLORHEXIDINE GLUCONATE 0.12 % MT SOLN: 15.0000 mL | Freq: Once | OROMUCOSAL | Status: AC

## 2022-05-18 MED ORDER — PROPOFOL 10 MG/ML IV BOLUS
INTRAVENOUS | Status: DC | PRN
  Administered 2022-05-18: 150 mg via INTRAVENOUS

## 2022-05-18 MED ORDER — FAMOTIDINE 20 MG PO TABS: 20.0000 mg | ORAL_TABLET | Freq: Once | ORAL | Status: AC

## 2022-05-18 MED ORDER — KETOROLAC TROMETHAMINE 30 MG/ML IJ SOLN
INTRAMUSCULAR | Status: DC | PRN
  Administered 2022-05-18: 30 mg via INTRAVENOUS

## 2022-05-18 MED ORDER — ACETAMINOPHEN 500 MG PO TABS
ORAL_TABLET | ORAL | Status: AC
  Administered 2022-05-18: 1000 mg via ORAL
  Filled 2022-05-18: qty 2

## 2022-05-18 MED ORDER — FENTANYL CITRATE (PF) 100 MCG/2ML IJ SOLN
INTRAMUSCULAR | Status: AC
  Filled 2022-05-18: qty 2

## 2022-05-18 MED ORDER — DEXAMETHASONE SODIUM PHOSPHATE 10 MG/ML IJ SOLN
INTRAMUSCULAR | Status: DC | PRN
  Administered 2022-05-18: 10 mg via INTRAVENOUS

## 2022-05-18 MED ORDER — POVIDONE-IODINE 10 % EX SWAB
2.0000 | Freq: Once | CUTANEOUS | Status: AC
  Administered 2022-05-18: 2 via TOPICAL

## 2022-05-18 MED ORDER — FAMOTIDINE 20 MG PO TABS
ORAL_TABLET | ORAL | Status: AC
  Administered 2022-05-18: 20 mg via ORAL
  Filled 2022-05-18: qty 1

## 2022-05-18 MED ORDER — PHENYLEPHRINE HCL (PRESSORS) 10 MG/ML IV SOLN
INTRAVENOUS | Status: DC | PRN
  Administered 2022-05-18 (×4): 80 ug via INTRAVENOUS

## 2022-05-18 MED ORDER — SUGAMMADEX SODIUM 200 MG/2ML IV SOLN
INTRAVENOUS | Status: DC | PRN
  Administered 2022-05-18: 200 mg via INTRAVENOUS

## 2022-05-18 MED ORDER — ONDANSETRON HCL 4 MG/2ML IJ SOLN
INTRAMUSCULAR | Status: DC | PRN
  Administered 2022-05-18: 4 mg via INTRAVENOUS

## 2022-05-18 MED ORDER — ONDANSETRON HCL 4 MG PO TABS: 4.0000 mg | ORAL_TABLET | Freq: Three times a day (TID) | ORAL | Status: DC | PRN

## 2022-05-18 MED ORDER — LIDOCAINE HCL (CARDIAC) PF 100 MG/5ML IV SOSY
PREFILLED_SYRINGE | INTRAVENOUS | Status: DC | PRN
  Administered 2022-05-18: 100 mg via INTRAVENOUS

## 2022-05-18 MED ORDER — ROCURONIUM BROMIDE 100 MG/10ML IV SOLN
INTRAVENOUS | Status: DC | PRN
  Administered 2022-05-18: 60 mg via INTRAVENOUS

## 2022-05-18 MED ORDER — GABAPENTIN 300 MG PO CAPS
ORAL_CAPSULE | ORAL | Status: AC
  Administered 2022-05-18: 300 mg via ORAL
  Filled 2022-05-18: qty 1

## 2022-05-18 SURGICAL SUPPLY — 40 items
BAG DRN RND TRDRP ANRFLXCHMBR (UROLOGICAL SUPPLIES)
BAG URINE DRAIN 2000ML AR STRL (UROLOGICAL SUPPLIES) IMPLANT
CATH FOLEY 2WAY  5CC 16FR (CATHETERS)
CATH FOLEY 2WAY 5CC 16FR (CATHETERS)
CATH ROBINSON RED A/P 16FR (CATHETERS) ×1 IMPLANT
CATH URTH 16FR FL 2W BLN LF (CATHETERS) IMPLANT
DRAPE PERI LITHO V/GYN (MISCELLANEOUS) ×1 IMPLANT
DRAPE SURG 17X11 SM STRL (DRAPES) ×1 IMPLANT
DRAPE UNDER BUTTOCK W/FLU (DRAPES) ×1 IMPLANT
ELECT REM PT RETURN 9FT ADLT (ELECTROSURGICAL) ×1
ELECTRODE REM PT RTRN 9FT ADLT (ELECTROSURGICAL) ×1 IMPLANT
GAUZE 4X4 16PLY ~~LOC~~+RFID DBL (SPONGE) ×2 IMPLANT
GLOVE SURG SYN 8.0 (GLOVE) ×12 IMPLANT
GLOVE SURG SYN 8.0 PF PI (GLOVE) ×1 IMPLANT
GOWN STRL REUS W/ TWL LRG LVL3 (GOWN DISPOSABLE) ×3 IMPLANT
GOWN STRL REUS W/ TWL XL LVL3 (GOWN DISPOSABLE) ×1 IMPLANT
GOWN STRL REUS W/TWL LRG LVL3 (GOWN DISPOSABLE) ×3
GOWN STRL REUS W/TWL XL LVL3 (GOWN DISPOSABLE) ×1
KIT TURNOVER CYSTO (KITS) ×1 IMPLANT
LABEL OR SOLS (LABEL) ×1 IMPLANT
MANIFOLD NEPTUNE II (INSTRUMENTS) ×1 IMPLANT
NEEDLE HYPO 22GX1.5 SAFETY (NEEDLE) ×1 IMPLANT
PACK BASIN MINOR ARMC (MISCELLANEOUS) ×1 IMPLANT
PAD OB MATERNITY 4.3X12.25 (PERSONAL CARE ITEMS) ×1 IMPLANT
PAD PREP 24X41 OB/GYN DISP (PERSONAL CARE ITEMS) ×1 IMPLANT
SCRUB CHG 4% DYNA-HEX 4OZ (MISCELLANEOUS) ×1 IMPLANT
SOL SCRUB PVP POV-IOD 4OZ 7.5% (MISCELLANEOUS) ×1
SOLUTION SCRB POV-IOD 4OZ 7.5% (MISCELLANEOUS) ×1 IMPLANT
SURGILUBE 2OZ TUBE FLIPTOP (MISCELLANEOUS) ×1 IMPLANT
SUT PDS 2-0 27IN (SUTURE) IMPLANT
SUT VIC AB 0 CT1 27 (SUTURE) ×3
SUT VIC AB 0 CT1 27XCR 8 STRN (SUTURE) ×2 IMPLANT
SUT VIC AB 0 CT1 36 (SUTURE) ×1 IMPLANT
SUT VIC AB 2-0 SH 27 (SUTURE)
SUT VIC AB 2-0 SH 27XBRD (SUTURE) ×1 IMPLANT
SYR 10ML LL (SYRINGE) ×1 IMPLANT
SYR CONTROL 10ML LL (SYRINGE) ×1 IMPLANT
TRAP FLUID SMOKE EVACUATOR (MISCELLANEOUS) ×1 IMPLANT
WATER STERILE IRR 1000ML POUR (IV SOLUTION) ×1 IMPLANT
WATER STERILE IRR 500ML POUR (IV SOLUTION) ×1 IMPLANT

## 2022-05-18 NOTE — Brief Op Note (Signed)
05/18/2022  11:32 AM  PATIENT:  Nichole Bowen  37 y.o. female  PRE-OPERATIVE DIAGNOSIS:  cervical dysplasia  POST-OPERATIVE DIAGNOSIS:  cervical dysplasia  PROCEDURE:  Procedure(s): HYSTERECTOMY VAGINAL WITH BILATERAL SALPINGECTOMY (Bilateral)  SURGEON:  Surgeon(s) and Role:    * Alliah Boulanger, Gwen Her, MD - Primary    * Benjaman Kindler, MD - Assisting  PHYSICIAN ASSISTANT: Pa student [place]   ASSISTANTS: none   ANESTHESIA:   general  EBL:  50 mL  IOF 1300 cc ou 300 cc  BLOOD ADMINISTERED:none  DRAINS: none   LOCAL MEDICATIONS USED:  LIDOCAINE   SPECIMEN:  Source of Specimen:  cervix , uterus and bilateral fallopian tubes   DISPOSITION OF SPECIMEN:  PATHOLOGY  COUNTS:  YES  TOURNIQUET:  * No tourniquets in log *  DICTATION: .Other Dictation: Dictation Number verbal  PLAN OF CARE: Discharge to home after PACU  PATIENT DISPOSITION:  PACU - hemodynamically stable.   Delay start of Pharmacological VTE agent (>24hrs) due to surgical blood loss or risk of bleeding: not applicable

## 2022-05-18 NOTE — Anesthesia Preprocedure Evaluation (Signed)
Anesthesia Evaluation  Patient identified by MRN, date of birth, ID band Patient awake    Reviewed: Allergy & Precautions, NPO status , Patient's Chart, lab work & pertinent test results  Airway Mallampati: III  TM Distance: >3 FB Neck ROM: full    Dental  (+) Chipped, Dental Advidsory Given   Pulmonary neg pulmonary ROS, neg shortness of breath   Pulmonary exam normal        Cardiovascular (-) angina (-) Past MI and (-) CABG negative cardio ROS Normal cardiovascular exam     Neuro/Psych negative neurological ROS  negative psych ROS   GI/Hepatic negative GI ROS, Neg liver ROS,,,  Endo/Other  negative endocrine ROS    Renal/GU      Musculoskeletal   Abdominal   Peds  Hematology negative hematology ROS (+)   Anesthesia Other Findings Past Medical History: 04/2022: Cervical intraepithelial neoplasia grade 2 2022: Trichomoniasis  Past Surgical History: 04/2022:  colposcopic biopsy No date: TUBAL LIGATION 2016: WISDOM TOOTH EXTRACTION     Comment:  x 2     Reproductive/Obstetrics negative OB ROS                             Anesthesia Physical Anesthesia Plan  ASA: 2  Anesthesia Plan: General ETT   Post-op Pain Management:    Induction: Intravenous  PONV Risk Score and Plan: 4 or greater and Ondansetron, Dexamethasone and Midazolam  Airway Management Planned: Oral ETT  Additional Equipment:   Intra-op Plan:   Post-operative Plan: Extubation in OR  Informed Consent: I have reviewed the patients History and Physical, chart, labs and discussed the procedure including the risks, benefits and alternatives for the proposed anesthesia with the patient or authorized representative who has indicated his/her understanding and acceptance.     Dental Advisory Given  Plan Discussed with: Anesthesiologist, CRNA and Surgeon  Anesthesia Plan Comments: (Patient consented for risks of  anesthesia including but not limited to:  - adverse reactions to medications - damage to eyes, teeth, lips or other oral mucosa - nerve damage due to positioning  - sore throat or hoarseness - Damage to heart, brain, nerves, lungs, other parts of body or loss of life  Patient voiced understanding.)       Anesthesia Quick Evaluation

## 2022-05-18 NOTE — Transfer of Care (Signed)
Immediate Anesthesia Transfer of Care Note  Patient: Nichole Bowen  Procedure(s) Performed: HYSTERECTOMY VAGINAL WITH BILATERAL SALPINGECTOMY (Bilateral)  Patient Location: PACU  Anesthesia Type:General  Level of Consciousness: drowsy  Airway & Oxygen Therapy: Patient Spontanous Breathing and Patient connected to face mask oxygen  Post-op Assessment: Report given to RN and Post -op Vital signs reviewed and stable  Post vital signs: Reviewed and stable  Last Vitals:  Vitals Value Taken Time  BP 91/63 05/18/22 1145  Temp 35.9   Pulse 70 05/18/22 1147  Resp 21 05/18/22 1147  SpO2 98 % 05/18/22 1147  Vitals shown include unvalidated device data.  Last Pain:  Vitals:   05/18/22 0925  TempSrc: Oral  PainSc: 0-No pain         Complications: No notable events documented.

## 2022-05-18 NOTE — Anesthesia Procedure Notes (Signed)
Procedure Name: Intubation Date/Time: 05/18/2022 10:01 AM  Performed by: Cammie Sickle, CRNAPre-anesthesia Checklist: Patient identified, Patient being monitored, Timeout performed, Emergency Drugs available and Suction available Patient Re-evaluated:Patient Re-evaluated prior to induction Oxygen Delivery Method: Circle system utilized Preoxygenation: Pre-oxygenation with 100% oxygen Induction Type: IV induction Ventilation: Mask ventilation without difficulty Laryngoscope Size: 3 and McGraph Grade View: Grade I Tube type: Oral Tube size: 7.0 mm Number of attempts: 1 Airway Equipment and Method: Stylet Placement Confirmation: ETT inserted through vocal cords under direct vision, positive ETCO2 and breath sounds checked- equal and bilateral Secured at: 21 cm Tube secured with: Tape Dental Injury: Teeth and Oropharynx as per pre-operative assessment  Comments: Smooth atraumatic intubation, no complications noted

## 2022-05-18 NOTE — Op Note (Signed)
NAME: Nichole Bowen, Nichole Bowen MEDICAL RECORD NO: [id number] ACCOUNT NO: [id number] DATE OF BIRTH: Jun 08, 1985 FACILITY: [facility] LOCATION: [facility] PHYSICIAN: Boykin Nearing, MD  Operative Report   DATE OF PROCEDURE: 05/18/2022  PREOPERATIVE DIAGNOSIS:  Moderate cervical dysplasia.  POSTOPERATIVE DIAGNOSIS:  Moderate cervical dysplasia.  PROCEDURE:   1.  Total vaginal hysterectomy. 2.  Bilateral salpingectomy.  ANESTHESIA:  General endotracheal anesthesia.  SURGEON:  Boykin Nearing, MD  FIRST ASSISTANT:  Benjaman Kindler, MD  SECOND ASSISTANT:  PA student, [place].  INDICATIONS:  37 year old gravida 3, para 3, patient with cervical dysplasia documented by colposcopy and cervical biopsies.  Pathology showed CIN II.  The patient has elected for definitive therapy versus conservative therapy, which was offered  including LEEP procedure conization.  DESCRIPTION OF PROCEDURE:  After adequate general endotracheal anesthesia, the patient was placed in dorsal supine position with the legs in the candy cane stirrups.  The patient's abdomen, perineum and vagina were prepped and draped in normal sterile  fashion.  Timeout was performed.  The patient did receive 2 grams of IV Ancef and 500 mg Flagyl for surgical prophylaxis.  Straight catheterization of the bladder yielded 275 mL clear urine.  Weighted speculum was placed in the posterior vaginal vault  and the cervix was grasped with 2 thyroid tenacula.  Cervix was circumferentially injected with 1% lidocaine with 1:100,000 epinephrine.  A direct posterior colpotomy incision was made.  Upon entry into the posterior cul-de-sac long billed speculum was  placed.  The uterosacral ligaments were bilaterally clamped, transected and suture ligated and tagged for later identification.  The anterior cervix was circumferentially incised.  The cardinal ligaments and the uterine arteries were then bilaterally  clamped, transected and suture  ligated with 0 Vicryl suture.  Entry into the anterior cul-de-sac was accomplished sharply.  Sequential clamping, cutting and suturing through the broad ligament all the way to the cornua, which were bilaterally clamped,  transected and the uterus and cervix were delivered.  Each cornual pedicle was doubly ligated.  Each distal portion of fallopian tube was identified and clamped and removed and suture ligated with 0 Vicryl suture.  Ovaries appeared normal.  Good  hemostasis noted.  The peritoneum was then closed with a 2-0 PDS pursestring suture.  The vaginal cuff was then closed with 0 Vicryl suture in a running nonlocking fashion.  The uterosacral ligaments were plicated centrally and the rest of the vaginal  cuff was closed.  There were no complications.  Recatheterization of the bladder yielded additional 25 mL clear urine.  There were no complications.  ESTIMATED BLOOD LOSS:  50 mL.  URINE OUTPUT:  300 mL.  INTRAOPERATIVE FLUIDS:  1300 mL.  The patient did receive 30 mg intravenous Toradol at the end of the procedure, she was taken to recovery room in good condition.   PUS D: 05/18/2022 12:12:27 pm T: 05/18/2022 12:28:00 pm  JOB: [id number]

## 2022-05-18 NOTE — Discharge Instructions (Signed)

## 2022-05-18 NOTE — Anesthesia Postprocedure Evaluation (Signed)
Anesthesia Post Note  Patient: Nichole Bowen  Procedure(s) Performed: HYSTERECTOMY VAGINAL WITH BILATERAL SALPINGECTOMY (Bilateral)  Patient location during evaluation: PACU Anesthesia Type: General Level of consciousness: awake and alert Pain management: pain level controlled Vital Signs Assessment: post-procedure vital signs reviewed and stable Respiratory status: spontaneous breathing, nonlabored ventilation, respiratory function stable and patient connected to nasal cannula oxygen Cardiovascular status: blood pressure returned to baseline and stable Postop Assessment: no apparent nausea or vomiting Anesthetic complications: no  No notable events documented.   Last Vitals:  Vitals:   05/18/22 1215 05/18/22 1229  BP: 102/76 105/75  Pulse: (!) 54 73  Resp: (!) 9 12  Temp: (!) 36.1 C (!) 36.1 C  SpO2: 100% 100%    Last Pain:  Vitals:   05/18/22 1229  TempSrc: Temporal  PainSc: 0-No pain                 Dimas Millin

## 2022-05-18 NOTE — Progress Notes (Signed)
Ready for [facility] and bilateral salpingectomy  Labs reviewed . All question answered. Proceed

## 2022-05-19 LAB — SURGICAL PATHOLOGY

## 2022-07-18 ENCOUNTER — Other Ambulatory Visit: Payer: Self-pay | Admitting: Obstetrics and Gynecology

## 2022-07-18 DIAGNOSIS — R102 Pelvic and perineal pain: Secondary | ICD-10-CM

## 2022-07-20 ENCOUNTER — Encounter: Payer: Self-pay | Admitting: Obstetrics and Gynecology

## 2022-07-25 ENCOUNTER — Ambulatory Visit
Admission: RE | Admit: 2022-07-25 | Discharge: 2022-07-25 | Disposition: A | Discharge status: Home / Self Care | Payer: Managed Care, Other (non HMO) | Source: Ambulatory Visit | Attending: Obstetrics and Gynecology | Admitting: Obstetrics and Gynecology

## 2022-07-25 DIAGNOSIS — R102 Pelvic and perineal pain: Secondary | ICD-10-CM

## 2022-07-25 MED ORDER — IOPAMIDOL (ISOVUE-300) INJECTION 61%
100.0000 mL | Freq: Once | INTRAVENOUS | Status: AC | PRN
  Administered 2022-07-25: 100 mL via INTRAVENOUS

## 2022-08-15 ENCOUNTER — Other Ambulatory Visit: Payer: Self-pay | Admitting: Obstetrics and Gynecology

## 2022-08-16 NOTE — H&P (Signed)
Nichole Bowen is a 37 y.o. female here for l/s evaluation of pelvic pain s/p TVH  .   Pt here for follow for pelvic pain since her [facility] 05/09/22 She finished a course of ABX , flagyl and augmentin without change in her pain . ++ Dyspareunia and urination make the pain worse . She feels like she can empty ok . No urgency  No fever   U/s: 07/03/22   Hysterectomy   Rt ovary appears wnl Left ovary volume=13.81ml Rt adnexal complex mass(sup & lat  to ROV)=3.08 x 1.85 x 1.79cm Lt adnexal complex mass(lat to LOV)=2.53 x 1.66 x 2.04cm No free fluid seen      Ctscan pelvis : 07/25/22: CT PELVIS WITH CONTRAST   TECHNIQUE:  Multidetector CT imaging of the pelvis was performed using the  standard protocol following the bolus administration of intravenous  contrast.   RADIATION DOSE REDUCTION: This exam was performed according to the  departmental dose-optimization program which includes automated  exposure control, adjustment of the mA and/or kV according to  patient size and/or use of iterative reconstruction technique.   CONTRAST:  ISOVUE-300 IOPAMIDOL (ISOVUE-300) INJECTION 61%   COMPARISON:  None Available.   FINDINGS:  Urinary Tract: Distal ureters are decompressed. Bladder is grossly  unremarkable.   Bowel: None.   Vascular/Lymphatic: 10 mm left external iliac lymph node (5/30),  likely reactive. Vascular structures are unremarkable.   Reproductive: Ovaries are visualized. Partial hysterectomy. Locules  of air and increased vascularity throughout the surgical bed.   Other:  There may be trace pelvic free fluid.   Musculoskeletal: None.   IMPRESSION:  Partial hysterectomy with locules of air in the surgical bed and  increased vascularity, findings which may be postoperative in  etiology. Difficult to exclude superimposed infection.      Past Medical History:  has a past medical history of Fibroid (2016), History of abnormal cervical Pap smear (2016), History of  stroke (2002), and trichomoniasis (2022).  Past Surgical History:  has a past surgical history that includes Laparoscopic tubal ligation; colposcopy (2017); Hysterectomy Vaginal (05/09/2022); and Tubal ligation (2008). Family History: family history includes Breast cancer in her maternal aunt; Diabetes in her mother. Social History:  reports that she has never smoked. She has never used smokeless tobacco. She reports current alcohol use. She reports that she does not use drugs. OB/GYN History:  OB History       Gravida  3   Para  3   Term  3   Preterm      AB      Living  3        SAB      IAB      Ectopic      Molar      Multiple      Live Births  3             Allergies: is allergic to tomato and aspirin. Medications:  Current Medications    Current Outpatient Medications:    cyclobenzaprine (FLEXERIL) 10 MG tablet, Take 1 tablet (10 mg total) by mouth 3 (three) times daily as needed for Muscle spasms (Patient not taking: Reported on 02/23/2022), Disp: 15 tablet, Rfl: 0   gabapentin (NEURONTIN) 300 MG capsule, Take 1 capsule (300 mg total) by mouth at bedtime for 10 days (Patient not taking: Reported on 06/27/2022), Disp: 10 capsule, Rfl: 0   ibuprofen (ADVIL,MOTRIN) 600 MG tablet, Take 1 tablet (600 mg total) by mouth every  6 (six) hours as needed for Pain (Patient not taking: Reported on 02/23/2022), Disp: 30 tablet, Rfl: 0   ibuprofen (MOTRIN) 800 MG tablet, Take 1 tablet (800 mg total) by mouth every 8 (eight) hours as needed for Pain (Patient not taking: Reported on 06/01/2022), Disp: 30 tablet, Rfl: 1   ibuprofen (MOTRIN) 800 MG tablet, Take 1 tablet (800 mg total) by mouth every 8 (eight) hours as needed for Pain (Patient not taking: Reported on 08/08/2022), Disp: 30 tablet, Rfl: 1   ondansetron (ZOFRAN) 8 MG tablet, Take 1 tablet (8 mg total) by mouth every 8 (eight) hours as needed for Nausea (Patient not taking: Reported on 06/01/2022), Disp: 30 tablet, Rfl: 0    oxyCODONE-acetaminophen (PERCOCET) 5-325 mg tablet, Take 1 tablet by mouth every 4 (four) hours as needed for Pain (Patient not taking: Reported on 06/01/2022), Disp: 20 tablet, Rfl: 0   oxyCODONE-acetaminophen (PERCOCET) 5-325 mg tablet, Take 1 tablet by mouth every 8 (eight) hours as needed for Pain (Patient not taking: Reported on 06/15/2022), Disp: 15 tablet, Rfl: 0     Review of Systems: General:                      No fatigue or weight loss Eyes:                           No vision changes Ears:                            No hearing difficulty Respiratory:                No cough or shortness of breath Pulmonary:                  No asthma or shortness of breath Cardiovascular:           No chest pain, palpitations, dyspnea on exertion Gastrointestinal:          No abdominal bloating, chronic diarrhea, constipations, masses, pain or hematochezia Genitourinary:             No hematuria, dysuria, abnormal vaginal discharge, pelvic pain, Menometrorrhagia Lymphatic:                   No swollen lymph nodes Musculoskeletal:No muscle weakness Neurologic:                  No extremity weakness, syncope, seizure disorder Psychiatric:                  No history of depression, delusions or suicidal/homicidal ideation      Exam:       Vitals:    08/08/22 1445  BP: 113/76  Pulse: 85      Body mass index is 26.95 kg/m.   WDWN  female in NAD   Lungs: CTA  CV : RRR without murmur    Neck:  no thyromegaly Abdomen: soft , no mass, normal active bowel sounds,  non-tender, no rebound tenderness Pelvic: tanner stage 5 ,  External genitalia: vulva /labia no lesions Urethra: no prolapse Vagina: normal physiologic d/c, + TTP central cuff  Slight fulness ZO:XWRUEA  UTX: absent Adnexa: no mass,  non-tender   Rectovaginal: Impression:    The primary encounter diagnosis was Pelvic pain in female. A diagnosis of Dysuria was also pertinent to this visit.   Possible adhesions to cuff .  Air  seen on CtScan  Etiology uncertain      Plan:    Cbc , U a C+S   Recommend direct visualization with L/S and cystoscopy  LOA if found Refill Norco , motrin and gabapentin  Risks of the procedure have been discussed with pt - See [facility] notes         Vilma Prader, MD

## 2022-08-28 ENCOUNTER — Encounter
Admission: RE | Admit: 2022-08-28 | Discharge: 2022-08-28 | Disposition: A | Discharge status: Home / Self Care | Payer: Managed Care, Other (non HMO) | Source: Ambulatory Visit | Attending: Obstetrics and Gynecology | Admitting: Obstetrics and Gynecology

## 2022-08-28 ENCOUNTER — Encounter: Payer: Self-pay | Admitting: *Deleted

## 2022-08-28 NOTE — Patient Instructions (Addendum)
Your procedure is scheduled on: Tuesday, June 18 Report to the Registration Desk on the 1st floor of the [facility] To find out your arrival time, please call [phone] between 1PM - 3PM on: Monday, June 17 If your arrival time is 6:00 am, do not arrive before that time as the [facility] entrance doors do not open until 6:00 am.  REMEMBER: Instructions that are not followed completely may result in serious medical risk, up to and including death; or upon the discretion of your surgeon and anesthesiologist your surgery may need to be rescheduled.  Do not eat food after midnight the night before surgery.  No gum chewing or hard candies.  You may however, drink CLEAR liquids up to 2 hours before you are scheduled to arrive for your surgery. Do not drink anything within 2 hours of your scheduled arrival time.  Clear liquids include: - water  - apple juice without pulp - gatorade (not RED colors) - black coffee or tea (Do NOT add milk or creamers to the coffee or tea) Do NOT drink anything that is not on this list.  In addition, your doctor has ordered for you to drink the provided:  Ensure Pre-Surgery Clear Carbohydrate Drink  Drinking this carbohydrate drink up to two hours before surgery helps to reduce insulin resistance and improve patient outcomes. Please complete drinking 2 hours before scheduled arrival time.  One week prior to surgery: starting June 11 Stop Anti-inflammatories (NSAIDS) such as Advil, Aleve, Ibuprofen, Motrin, Naproxen, Naprosyn and Aspirin based products such as Excedrin, Goody's Powder, BC Powder. Stop ANY OVER THE COUNTER supplements until after surgery. You may however, continue to take Tylenol if needed for pain up until the day of surgery.  DO NOT TAKE ANY MEDICATIONS THE MORNING OF SURGERY   No Alcohol for 24 hours before or after surgery.  No Smoking including e-cigarettes for 24 hours before surgery.  No chewable tobacco products for at least 6  hours before surgery.  No nicotine patches on the day of surgery.  Do not use any "recreational" drugs for at least a week (preferably 2 weeks) before your surgery.  Please be advised that the combination of cocaine and anesthesia may have negative outcomes, up to and including death. If you test positive for cocaine, your surgery will be cancelled.  On the morning of surgery brush your teeth with toothpaste and water, you may rinse your mouth with mouthwash if you wish. Do not swallow any toothpaste or mouthwash.  Use CHG Soap as directed on instruction sheet.  Do not wear jewelry, make-up, hairpins, clips or nail polish.  Do not wear lotions, powders, or perfumes.   Do not shave body hair from the neck down 48 hours before surgery.  Contact lenses, hearing aids and dentures may not be worn into surgery.  Do not bring valuables to the hospital. [facility] is not responsible for any missing/lost belongings or valuables.   Notify your doctor if there is any change in your medical condition (cold, fever, infection).  Wear comfortable clothing (specific to your surgery type) to the hospital.  After surgery, you can help prevent lung complications by doing breathing exercises.  Take deep breaths and cough every 1-2 hours. Your doctor may order a device called an Incentive Spirometer to help you take deep breaths. When coughing or sneezing, hold a pillow firmly against your incision with both hands. This is called "splinting." Doing this helps protect your incision. It also decreases belly discomfort.  If you are being discharged the day of surgery, you will not be allowed to drive home. You will need a responsible individual to drive you home and stay with you for 24 hours after surgery.   If you are taking public transportation, you will need to have a responsible individual with you.  Please call the Pre-admissions Testing Dept. at [phone] if you have any questions about  these instructions.  Surgery Visitation Policy:  Patients having surgery or a procedure may have two visitors.  Children under the age of 68 must have an adult with them who is not the patient.     Preparing for Surgery with CHLORHEXIDINE GLUCONATE (CHG) Soap  Chlorhexidine Gluconate (CHG) Soap  o An antiseptic cleaner that kills germs and bonds with the skin to continue killing germs even after washing  o Used for showering the night before surgery and morning of surgery  Before surgery, you can play an important role by reducing the number of germs on your skin.  CHG (Chlorhexidine gluconate) soap is an antiseptic cleanser which kills germs and bonds with the skin to continue killing germs even after washing.  Please do not use if you have an allergy to CHG or antibacterial soaps. If your skin becomes reddened/irritated stop using the CHG.  1. Shower the NIGHT BEFORE SURGERY and the MORNING OF SURGERY with CHG soap.  2. If you choose to wash your hair, wash your hair first as usual with your normal shampoo.  3. After shampooing, rinse your hair and body thoroughly to remove the shampoo.  4. Use CHG as you would any other liquid soap. You can apply CHG directly to the skin and wash gently with a scrungie or a clean washcloth.  5. Apply the CHG soap to your body only from the neck down. Do not use on open wounds or open sores. Avoid contact with your eyes, ears, mouth, and genitals (private parts). Wash face and genitals (private parts) with your normal soap.  6. Wash thoroughly, paying special attention to the area where your surgery will be performed.  7. Thoroughly rinse your body with warm water.  8. Do not shower/wash with your normal soap after using and rinsing off the CHG soap.  9. Pat yourself dry with a clean towel.  10. Wear clean pajamas to bed the night before surgery.  12. Place clean sheets on your bed the night of your first shower and do not sleep with  pets.  13. Shower again with the CHG soap on the day of surgery prior to arriving at the hospital.  14. Do not apply any deodorants/lotions/powders.  15. Please wear clean clothes to the hospital.

## 2022-08-30 ENCOUNTER — Encounter
Admission: RE | Admit: 2022-08-30 | Discharge: 2022-08-30 | Disposition: A | Discharge status: Home / Self Care | Payer: Managed Care, Other (non HMO) | Source: Ambulatory Visit | Attending: Obstetrics and Gynecology | Admitting: Obstetrics and Gynecology

## 2022-08-30 DIAGNOSIS — Z01818 Encounter for other preprocedural examination: Secondary | ICD-10-CM | POA: Diagnosis present

## 2022-08-30 LAB — TYPE AND SCREEN
ABO/RH(D): O POS
Antibody Screen: NEGATIVE

## 2022-08-30 LAB — BASIC METABOLIC PANEL
Anion gap: 10 (ref 5–15)
BUN: 8 mg/dL (ref 6–20)
CO2: 26 mmol/L (ref 22–32)
Calcium: 8.8 mg/dL — ABNORMAL LOW (ref 8.9–10.3)
Chloride: 105 mmol/L (ref 98–111)
Creatinine, Ser: 0.74 mg/dL (ref 0.44–1.00)
GFR, Estimated: >60 mL/min (ref >60)
Glucose, Bld: 83 mg/dL (ref 70–99)
Potassium: 3.5 mmol/L (ref 3.5–5.1)
Sodium: 141 mmol/L (ref 135–145)

## 2022-08-30 LAB — CBC
HCT: 40.7 % (ref 36.0–46.0)
Hemoglobin: 13.2 g/dL (ref 12.0–15.0)
MCH: 27.8 pg (ref 26.0–34.0)
MCHC: 32.4 g/dL (ref 30.0–36.0)
MCV: 85.9 fL (ref 80.0–100.0)
Platelets: 393 10*3/uL (ref 150–400)
RBC: 4.74 MIL/uL (ref 3.87–5.11)
RDW: 13.6 % (ref 11.5–15.5)
WBC: 5.9 10*3/uL (ref 4.0–10.5)
nRBC: 0 % (ref 0.0–0.2)

## 2022-09-04 MED ORDER — FAMOTIDINE 20 MG PO TABS
20.0000 mg | ORAL_TABLET | Freq: Once | ORAL | Status: AC
  Administered 2022-09-05: 20 mg via ORAL

## 2022-09-04 MED ORDER — ACETAMINOPHEN 500 MG PO TABS
1000.0000 mg | ORAL_TABLET | ORAL | Status: AC
  Administered 2022-09-05: 1000 mg via ORAL

## 2022-09-04 MED ORDER — LACTATED RINGERS IV SOLN: INTRAVENOUS | Status: DC

## 2022-09-04 MED ORDER — GABAPENTIN 300 MG PO CAPS
300.0000 mg | ORAL_CAPSULE | ORAL | Status: AC
  Administered 2022-09-05: 300 mg via ORAL

## 2022-09-04 MED ORDER — CHLORHEXIDINE GLUCONATE 0.12 % MT SOLN
15.0000 mL | Freq: Once | OROMUCOSAL | Status: AC
  Administered 2022-09-05: 15 mL via OROMUCOSAL

## 2022-09-04 MED ORDER — ORAL CARE MOUTH RINSE: 15.0000 mL | Freq: Once | OROMUCOSAL | Status: AC

## 2022-09-04 MED ORDER — POVIDONE-IODINE 10 % EX SWAB: 2.0000 | Freq: Once | CUTANEOUS | Status: DC

## 2022-09-05 ENCOUNTER — Other Ambulatory Visit: Payer: Self-pay

## 2022-09-05 ENCOUNTER — Encounter: Payer: Self-pay | Admitting: Obstetrics and Gynecology

## 2022-09-05 ENCOUNTER — Ambulatory Visit: Payer: Managed Care, Other (non HMO)

## 2022-09-05 ENCOUNTER — Encounter
Admission: RE | Disposition: A | Discharge status: Home / Self Care | Payer: Self-pay | Source: Home / Self Care | Attending: Obstetrics and Gynecology

## 2022-09-05 ENCOUNTER — Ambulatory Visit: Payer: Managed Care, Other (non HMO) | Admitting: Urgent Care

## 2022-09-05 ENCOUNTER — Ambulatory Visit
Admission: RE | Admit: 2022-09-05 | Discharge: 2022-09-05 | Disposition: A | Discharge status: Home / Self Care | Payer: Managed Care, Other (non HMO) | Attending: Obstetrics and Gynecology | Admitting: Obstetrics and Gynecology

## 2022-09-05 DIAGNOSIS — N736 Female pelvic peritoneal adhesions (postinfective): Secondary | ICD-10-CM | POA: Insufficient documentation

## 2022-09-05 DIAGNOSIS — Z90710 Acquired absence of both cervix and uterus: Secondary | ICD-10-CM | POA: Diagnosis not present

## 2022-09-05 DIAGNOSIS — Z79899 Other long term (current) drug therapy: Secondary | ICD-10-CM | POA: Insufficient documentation

## 2022-09-05 DIAGNOSIS — Z01818 Encounter for other preprocedural examination: Secondary | ICD-10-CM

## 2022-09-05 HISTORY — PX: LAPAROSCOPY: SHX197

## 2022-09-05 SURGERY — LAPAROSCOPY OPERATIVE
Anesthesia: General

## 2022-09-05 MED ORDER — OXYCODONE HCL 5 MG PO TABS: 5.0000 mg | ORAL_TABLET | Freq: Once | ORAL | Status: DC | PRN

## 2022-09-05 MED ORDER — ROCURONIUM BROMIDE 10 MG/ML (PF) SYRINGE
PREFILLED_SYRINGE | INTRAVENOUS | Status: AC
  Filled 2022-09-05: qty 10

## 2022-09-05 MED ORDER — ACETAMINOPHEN 10 MG/ML IV SOLN: 1000.0000 mg | Freq: Once | INTRAVENOUS | Status: DC | PRN

## 2022-09-05 MED ORDER — FENTANYL CITRATE (PF) 100 MCG/2ML IJ SOLN: 25.0000 ug | INTRAMUSCULAR | Status: DC | PRN

## 2022-09-05 MED ORDER — ONDANSETRON HCL 4 MG/2ML IJ SOLN
INTRAMUSCULAR | Status: AC
  Filled 2022-09-05: qty 2

## 2022-09-05 MED ORDER — SUGAMMADEX SODIUM 200 MG/2ML IV SOLN
INTRAVENOUS | Status: DC | PRN
  Administered 2022-09-05: 200 mg via INTRAVENOUS

## 2022-09-05 MED ORDER — MIDAZOLAM HCL 2 MG/2ML IJ SOLN
INTRAMUSCULAR | Status: AC
  Filled 2022-09-05: qty 2

## 2022-09-05 MED ORDER — LIDOCAINE HCL (CARDIAC) PF 100 MG/5ML IV SOSY
PREFILLED_SYRINGE | INTRAVENOUS | Status: DC | PRN
  Administered 2022-09-05: 60 mg via INTRAVENOUS

## 2022-09-05 MED ORDER — PROPOFOL 10 MG/ML IV BOLUS
INTRAVENOUS | Status: DC | PRN
  Administered 2022-09-05: 50 ug/kg/min via INTRAVENOUS
  Administered 2022-09-05: 120 mg via INTRAVENOUS

## 2022-09-05 MED ORDER — 0.9 % SODIUM CHLORIDE (POUR BTL) OPTIME
TOPICAL | Status: DC | PRN
  Administered 2022-09-05: 500 mL

## 2022-09-05 MED ORDER — FENTANYL CITRATE (PF) 100 MCG/2ML IJ SOLN
INTRAMUSCULAR | Status: AC
  Filled 2022-09-05: qty 2

## 2022-09-05 MED ORDER — ROCURONIUM BROMIDE 100 MG/10ML IV SOLN
INTRAVENOUS | Status: DC | PRN
  Administered 2022-09-05: 10 mg via INTRAVENOUS
  Administered 2022-09-05: 50 mg via INTRAVENOUS
  Administered 2022-09-05: 10 mg via INTRAVENOUS

## 2022-09-05 MED ORDER — CHLORHEXIDINE GLUCONATE 0.12 % MT SOLN
OROMUCOSAL | Status: AC
  Filled 2022-09-05: qty 15

## 2022-09-05 MED ORDER — OXYCODONE HCL 5 MG/5ML PO SOLN: 5.0000 mg | Freq: Once | ORAL | Status: DC | PRN

## 2022-09-05 MED ORDER — LIDOCAINE HCL (PF) 2 % IJ SOLN
INTRAMUSCULAR | Status: AC
  Filled 2022-09-05: qty 5

## 2022-09-05 MED ORDER — PROPOFOL 10 MG/ML IV BOLUS
INTRAVENOUS | Status: AC
  Filled 2022-09-05: qty 20

## 2022-09-05 MED ORDER — GABAPENTIN 300 MG PO CAPS
ORAL_CAPSULE | ORAL | Status: AC
  Filled 2022-09-05: qty 1

## 2022-09-05 MED ORDER — FAMOTIDINE 20 MG PO TABS
ORAL_TABLET | ORAL | Status: AC
  Filled 2022-09-05: qty 1

## 2022-09-05 MED ORDER — KETOROLAC TROMETHAMINE 15 MG/ML IJ SOLN
INTRAMUSCULAR | Status: DC | PRN
  Administered 2022-09-05: 15 mg via INTRAVENOUS

## 2022-09-05 MED ORDER — FENTANYL CITRATE (PF) 100 MCG/2ML IJ SOLN
25.0000 ug | INTRAMUSCULAR | Status: DC | PRN
  Administered 2022-09-05 (×2): 25 ug via INTRAVENOUS
  Administered 2022-09-05: 50 ug via INTRAVENOUS
  Administered 2022-09-05 (×2): 25 ug via INTRAVENOUS

## 2022-09-05 MED ORDER — DEXMEDETOMIDINE HCL IN NACL 80 MCG/20ML IV SOLN
INTRAVENOUS | Status: DC | PRN
  Administered 2022-09-05: 8 ug via INTRAVENOUS
  Administered 2022-09-05 (×3): 4 ug via INTRAVENOUS

## 2022-09-05 MED ORDER — BUPIVACAINE HCL 0.5 % IJ SOLN
INTRAMUSCULAR | Status: DC | PRN
  Administered 2022-09-05: 7 mL

## 2022-09-05 MED ORDER — HYDROMORPHONE HCL 1 MG/ML IJ SOLN
0.5000 mg | INTRAMUSCULAR | Status: DC | PRN
  Administered 2022-09-05: 0.5 mg via INTRAVENOUS

## 2022-09-05 MED ORDER — MIDAZOLAM HCL 2 MG/2ML IJ SOLN
INTRAMUSCULAR | Status: DC | PRN
  Administered 2022-09-05: 2 mg via INTRAVENOUS

## 2022-09-05 MED ORDER — ONDANSETRON HCL 4 MG/2ML IJ SOLN: 4.0000 mg | Freq: Once | INTRAMUSCULAR | Status: DC | PRN

## 2022-09-05 MED ORDER — FENTANYL CITRATE (PF) 100 MCG/2ML IJ SOLN
INTRAMUSCULAR | Status: DC | PRN
  Administered 2022-09-05 (×2): 50 ug via INTRAVENOUS

## 2022-09-05 MED ORDER — OXYCODONE HCL 5 MG PO TABS
5.0000 mg | ORAL_TABLET | ORAL | Status: DC | PRN
  Administered 2022-09-05: 5 mg via ORAL

## 2022-09-05 MED ORDER — PHENYLEPHRINE HCL (PRESSORS) 10 MG/ML IV SOLN
INTRAVENOUS | Status: DC | PRN
  Administered 2022-09-05: 40 ug via INTRAVENOUS

## 2022-09-05 MED ORDER — OXYCODONE HCL 5 MG PO TABS
ORAL_TABLET | ORAL | Status: AC
  Filled 2022-09-05: qty 1

## 2022-09-05 MED ORDER — BUPIVACAINE HCL (PF) 0.5 % IJ SOLN
INTRAMUSCULAR | Status: AC
  Filled 2022-09-05: qty 30

## 2022-09-05 MED ORDER — ONDANSETRON 4 MG PO TBDP: 4.0000 mg | ORAL_TABLET | Freq: Four times a day (QID) | ORAL | Status: DC | PRN

## 2022-09-05 MED ORDER — DEXAMETHASONE SODIUM PHOSPHATE 10 MG/ML IJ SOLN
INTRAMUSCULAR | Status: AC
  Filled 2022-09-05: qty 1

## 2022-09-05 MED ORDER — PROPOFOL 1000 MG/100ML IV EMUL
INTRAVENOUS | Status: AC
  Filled 2022-09-05: qty 100

## 2022-09-05 MED ORDER — ACETAMINOPHEN 500 MG PO TABS
ORAL_TABLET | ORAL | Status: AC
  Filled 2022-09-05: qty 2

## 2022-09-05 MED ORDER — ONDANSETRON HCL 4 MG/2ML IJ SOLN
INTRAMUSCULAR | Status: DC | PRN
  Administered 2022-09-05 (×2): 4 mg via INTRAVENOUS

## 2022-09-05 MED ORDER — HYDROMORPHONE HCL 1 MG/ML IJ SOLN
INTRAMUSCULAR | Status: AC
  Filled 2022-09-05: qty 1

## 2022-09-05 MED ORDER — DEXAMETHASONE SODIUM PHOSPHATE 10 MG/ML IJ SOLN
INTRAMUSCULAR | Status: DC | PRN
  Administered 2022-09-05: 10 mg via INTRAVENOUS

## 2022-09-05 SURGICAL SUPPLY — 52 items
APL PRP STRL LF DISP 70% ISPRP (MISCELLANEOUS)
APL SRG 38 LTWT LNG FL B (MISCELLANEOUS) ×1
APPLICATOR ARISTA FLEXITIP XL (MISCELLANEOUS) IMPLANT
BLADE SURG SZ11 CARB STEEL (BLADE) ×1 IMPLANT
CATH FOLEY 2WAY 5CC 16FR (CATHETERS) ×1
CATH ROBINSON RED A/P 16FR (CATHETERS) ×1 IMPLANT
CATH URTH 16FR FL 2W BLN LF (CATHETERS) ×1 IMPLANT
CHLORAPREP W/TINT 26 (MISCELLANEOUS) ×1 IMPLANT
DEFOGGER SCOPE WARMER CLEARIFY (MISCELLANEOUS) ×1 IMPLANT
DRSG TEGADERM 2-3/8X2-3/4 SM (GAUZE/BANDAGES/DRESSINGS) ×3 IMPLANT
GAUZE 4X4 16PLY ~~LOC~~+RFID DBL (SPONGE) ×1 IMPLANT
GAUZE SPONGE 2X2 STRL 8-PLY (GAUZE/BANDAGES/DRESSINGS) ×2 IMPLANT
GLOVE SURG SYN 8.0 (GLOVE) ×1 IMPLANT
GLOVE SURG SYN 8.0 PF PI (GLOVE) ×1 IMPLANT
GOWN STRL REUS W/ TWL LRG LVL3 (GOWN DISPOSABLE) ×1 IMPLANT
GOWN STRL REUS W/ TWL XL LVL3 (GOWN DISPOSABLE) ×1 IMPLANT
GOWN STRL REUS W/TWL LRG LVL3 (GOWN DISPOSABLE) ×1
GOWN STRL REUS W/TWL XL LVL3 (GOWN DISPOSABLE) ×1
GRASPER SUT TROCAR 14GX15 (MISCELLANEOUS) ×2 IMPLANT
HEMOSTAT ARISTA ABSORB 1G (HEMOSTASIS) IMPLANT
HEMOSTAT ARISTA ABSORB 3G PWDR (HEMOSTASIS) IMPLANT
IRRIGATION STRYKERFLOW (MISCELLANEOUS) ×1 IMPLANT
IRRIGATOR STRYKERFLOW (MISCELLANEOUS) ×1
IV NS 1000ML (IV SOLUTION) ×1
IV NS 1000ML BAXH (IV SOLUTION) ×1 IMPLANT
KIT PINK PAD W/HEAD ARE REST (MISCELLANEOUS) ×1
KIT PINK PAD W/HEAD ARM REST (MISCELLANEOUS) ×1 IMPLANT
KIT TURNOVER CYSTO (KITS) ×1 IMPLANT
KITTNER LAPARASCOPIC 5X40 (MISCELLANEOUS) IMPLANT
LABEL OR SOLS (LABEL) ×1 IMPLANT
MANIFOLD NEPTUNE II (INSTRUMENTS) ×1 IMPLANT
NS IRRIG 500ML POUR BTL (IV SOLUTION) ×1 IMPLANT
PACK GYN LAPAROSCOPIC (MISCELLANEOUS) ×1 IMPLANT
PAD OB MATERNITY 4.3X12.25 (PERSONAL CARE ITEMS) ×1 IMPLANT
PAD PREP OB/GYN DISP 24X41 (PERSONAL CARE ITEMS) ×1 IMPLANT
SCISSORS METZENBAUM CVD 33 (INSTRUMENTS) ×1 IMPLANT
SCRUB CHG 4% DYNA-HEX 4OZ (MISCELLANEOUS) ×1 IMPLANT
SET TUBE SMOKE EVAC HIGH FLOW (TUBING) ×1 IMPLANT
SHEARS HARMONIC ACE PLUS 36CM (ENDOMECHANICALS) IMPLANT
SLEEVE Z-THREAD 5X100MM (TROCAR) ×1 IMPLANT
STRIP CLOSURE SKIN 1/2X4 (GAUZE/BANDAGES/DRESSINGS) ×1 IMPLANT
SUT VIC AB 0 CT1 36 (SUTURE) ×1 IMPLANT
SUT VIC AB 2-0 UR6 27 (SUTURE) ×1 IMPLANT
SUT VIC AB 4-0 SH 27 (SUTURE) ×2
SUT VIC AB 4-0 SH 27XANBCTRL (SUTURE) ×2 IMPLANT
SWABSTK COMLB BENZOIN TINCTURE (MISCELLANEOUS) ×1 IMPLANT
SYS BAG RETRIEVAL 10MM (BASKET) ×1
SYSTEM BAG RETRIEVAL 10MM (BASKET) IMPLANT
TRAP FLUID SMOKE EVACUATOR (MISCELLANEOUS) ×1 IMPLANT
TROCAR Z-THRD FIOS HNDL 11X100 (TROCAR) ×1 IMPLANT
TROCAR Z-THREAD FIOS 5X100MM (TROCAR) ×1 IMPLANT
WATER STERILE IRR 500ML POUR (IV SOLUTION) ×1 IMPLANT

## 2022-09-05 NOTE — Transfer of Care (Signed)
Immediate Anesthesia Transfer of Care Note  Patient: Nichole Bowen  Procedure(s) Performed: LAPAROSCOPIC EVALUATION,EXTENSIVE PELVIC LYSIS OF ADHESIONS, RIGHT PERITUBAL CYSTECTOMY  Patient Location: PACU  Anesthesia Type:General  Level of Consciousness: drowsy  Airway & Oxygen Therapy: Patient Spontanous Breathing and Patient connected to face mask oxygen  Post-op Assessment: Report given to RN and Post -op Vital signs reviewed and stable  Post vital signs: Reviewed  Last Vitals:  Vitals Value Taken Time  BP 127/71 09/05/22 1632  Temp 36.6 C 09/05/22 1632  Pulse 63 09/05/22 1635  Resp 19 09/05/22 1635  SpO2 100 % 09/05/22 1635  Vitals shown include unvalidated device data.  Last Pain:  Vitals:   09/05/22 1051  PainSc: 0-No pain         Complications: No notable events documented.

## 2022-09-05 NOTE — Anesthesia Procedure Notes (Addendum)
Procedure Name: Intubation Date/Time: 09/05/2022 2:19 PM  Performed by: Lanette Hampshire, RNPre-anesthesia Checklist: Patient identified, Emergency Drugs available, Suction available and Patient being monitored Patient Re-evaluated:Patient Re-evaluated prior to induction Oxygen Delivery Method: Circle system utilized Preoxygenation: Pre-oxygenation with 100% oxygen Induction Type: IV induction Ventilation: Mask ventilation without difficulty Laryngoscope Size: McGraph and 3 Grade View: Grade I Tube type: Oral Tube size: 6.5 mm Number of attempts: 1 Airway Equipment and Method: Stylet and Video-laryngoscopy Placement Confirmation: ETT inserted through vocal cords under direct vision, positive ETCO2 and breath sounds checked- equal and bilateral Secured at: 22 cm Tube secured with: Tape Dental Injury: Teeth and Oropharynx as per pre-operative assessment

## 2022-09-05 NOTE — Anesthesia Preprocedure Evaluation (Signed)
Anesthesia Evaluation  Patient identified by MRN, date of birth, ID band Patient awake    Reviewed: Allergy & Precautions, NPO status , Patient's Chart, lab work & pertinent test results  History of Anesthesia Complications Negative for: history of anesthetic complications  Airway Mallampati: II  TM Distance: >3 FB Neck ROM: Full    Dental no notable dental hx. (+) Teeth Intact   Pulmonary neg pulmonary ROS, neg sleep apnea, neg COPD, Patient abstained from smoking.Not current smoker   Pulmonary exam normal breath sounds clear to auscultation       Cardiovascular Exercise Tolerance: Good METS(-) hypertension(-) CAD and (-) Past MI (-) dysrhythmias  Rhythm:Regular Rate:Normal - Systolic murmurs    Neuro/Psych negative neurological ROS  negative psych ROS   GI/Hepatic ,neg GERD  ,,(+)     (-) substance abuse    Endo/Other  neg diabetes    Renal/GU negative Renal ROS     Musculoskeletal   Abdominal   Peds  Hematology   Anesthesia Other Findings Past Medical History: 2002: Bell's palsy 04/2022: Cervical intraepithelial neoplasia grade 2 2022: Trichomoniasis  Reproductive/Obstetrics S/p hysterectomy                             Anesthesia Physical Anesthesia Plan  ASA: 1  Anesthesia Plan: General   Post-op Pain Management: Tylenol PO (pre-op)*, Gabapentin PO (pre-op)* and Toradol IV (intra-op)*   Induction: Intravenous  PONV Risk Score and Plan: 4 or greater and Ondansetron, Dexamethasone and Midazolam  Airway Management Planned: Oral ETT  Additional Equipment: None  Intra-op Plan:   Post-operative Plan: Extubation in OR  Informed Consent: I have reviewed the patients History and Physical, chart, labs and discussed the procedure including the risks, benefits and alternatives for the proposed anesthesia with the patient or authorized representative who has indicated his/her  understanding and acceptance.     Dental advisory given  Plan Discussed with: CRNA and Surgeon  Anesthesia Plan Comments: (Discussed risks of anesthesia with patient, including PONV, sore throat, lip/dental/eye damage. Rare risks discussed as well, such as cardiorespiratory and neurological sequelae, and allergic reactions. Discussed the role of CRNA in patient's perioperative care. Patient understands.)       Anesthesia Quick Evaluation

## 2022-09-05 NOTE — Progress Notes (Signed)
Pt ready for L/S eval of pelvis for pelvic pain status post TVH  . Labs reviewed . All question answered . Proceed

## 2022-09-05 NOTE — Brief Op Note (Signed)
09/05/2022  4:22 PM  PATIENT:  Nichole Bowen  37 y.o. female  PRE-OPERATIVE DIAGNOSIS:  pelvic pain s/p hysterectomy  POST-OPERATIVE DIAGNOSIS:  extensive pelvic adhesions   PROCEDURE:  laparoscopic pelvic adhesiolysis - extensive  Right paraovraian cystectomy SURGEON:  Surgeon(s) and Role:    * Liam Cammarata, Ihor Austin, MD - Primary    * Christeen Douglas, MD - Assisting  PHYSICIAN ASSISTANT: [place] student [place]   ASSISTANTS: none   ANESTHESIA:   general  EBL:  20 mL , IOF 600 cc  uo 200cc  BLOOD ADMINISTERED:none  DRAINS: none   LOCAL MEDICATIONS USED:  MARCAINE     SPECIMEN:  Source of Specimen:  right paraovarian cyst   DISPOSITION OF SPECIMEN:  PATHOLOGY  COUNTS:  YES  TOURNIQUET:  * No tourniquets in log *  DICTATION: .Other Dictation: Dictation Number verbal  PLAN OF CARE: Discharge to home after PACU  PATIENT DISPOSITION:  PACU - hemodynamically stable.   Delay start of Pharmacological VTE agent (>24hrs) due to surgical blood loss or risk of bleeding: not applicable

## 2022-09-05 NOTE — Anesthesia Postprocedure Evaluation (Signed)
Anesthesia Post Note  Patient: Nichole Bowen  Procedure(s) Performed: LAPAROSCOPIC EVALUATION,EXTENSIVE PELVIC LYSIS OF ADHESIONS, RIGHT PERITUBAL CYSTECTOMY  Patient location during evaluation: PACU Anesthesia Type: General Level of consciousness: awake and alert Pain management: pain level controlled Vital Signs Assessment: post-procedure vital signs reviewed and stable Respiratory status: spontaneous breathing, nonlabored ventilation, respiratory function stable and patient connected to nasal cannula oxygen Cardiovascular status: blood pressure returned to baseline and stable Postop Assessment: no apparent nausea or vomiting Anesthetic complications: no  No notable events documented.   Last Vitals:  Vitals:   09/05/22 1753 09/05/22 1807  BP: 100/69 108/68  Pulse: 60 67  Resp: 18 15  Temp:  (!) 36.1 C  SpO2: 100% 99%    Last Pain:  Vitals:   09/05/22 1807  TempSrc: Temporal  PainSc: 3                  Stephanie Coup

## 2022-09-05 NOTE — Op Note (Unsigned)
NAME: Nichole Bowen, OLIVERSON MEDICAL RECORD NO: [id number] ACCOUNT NO: [id number] DATE OF BIRTH: [birth year] FACILITY: [facility] LOCATION: [facility] PHYSICIAN: Suzy Bouchard, MD  Operative Report   DATE OF PROCEDURE: 09/05/2022  PREOPERATIVE DIAGNOSIS:  Pelvic pain, status post vaginal hysterectomy.  POSTOPERATIVE DIAGNOSES: 1.  Significant pelvic adhesive disease. 2.  Right paraovarian cyst.  PROCEDURES: 1.  Extensive laparoscopic pelvic adhesiolysis incorporating greater than 80% of operating time. 2.  Right paraovarian cystectomy.  SURGEON:  Suzy Bouchard, MD  FIRST ASSISTANT:  Christeen Douglas, [place] student Berna Bue.  ANESTHESIA:  General endotracheal anesthesia.  INDICATIONS:  A 37 year old female status post an uncomplicated total vaginal hysterectomy in 04/2022.  The patient developed pelvic pain within a month after the surgery that has not improved.  Ultrasound did not show any significant pathology.  DESCRIPTION OF PROCEDURE:  After adequate general endotracheal anesthesia, the patient was placed in dorsal supine position with the legs in the [place] stirrups.  The patient's abdomen, perineum and vagina were prepped and draped in normal sterile  fashion.  Timeout was performed.  Foley catheter was placed in the bladder used during the laparoscopic portion of the procedure and a sponge stick was placed in the vagina for vaginal manipulation for the procedure.  Gloves and gown were changed.   Attention was directed to the patient's abdomen where a 5 mm infraumbilical incision was made after injecting with 0.5% Marcaine, the 5 mm laparoscope was advanced into the abdominal cavity under direct visualization.  The patient's abdomen was  insufflated.  Second port placement was placed in left lower quadrant.  A 3 cm medial to the left anterior iliac spine, a 5 mm trocar was advanced under direct visualization.  Likewise on the right side a 3 cm medial to the right anterior  iliac spine, a  5 mm trocar was advanced.  Initial impression revealed multiple adhesions to the vaginal cuff, the bowel and the ovary were tethered together centrally and a somewhat diffuse complex all tethered to the apex of the vaginal cuff.  Harmonic scalpel was  brought up to the operative field over the next hour and 20 minutes, which was greater than 80% of total operating time and meticulous dissection of the vaginal cuff with the adhesions ensued.  The right ovary was freed from all adhesions and was then  able to descend into the right ovarian fossa without difficulty.  Posterior cul-de-sac was then fully visualized after removal of the vaginal cuff adhesions to the bowel.  There was a 3 x 2 cm nodule cyst that was juxtaposed to the right ovary.  This was  dissected free and transected and placed into an Endobag and removed through a 10 mm port, which was replaced by the 5 mm port in the left lower quadrant.  Some small oozing required Kleppinger cautery to that right paraovarian cyst site.   Intraabdominal pressure was lowered to 7 mmHg with good hemostasis.  1 gram of Arista was placed over the ovarian dissection site.  Good hemostasis noted.  Intraoperative pictures were taken.  Upper abdomen appeared normal.  The left lower port site was  closed with a 2-0 Vicryl suture with the Carter-Thomason cone system.  Good closure of the fascia.  The patient's abdomen was deflated and all three skin incisions were closed with interrupted 4-0 Vicryl suture.  Good cosmetic effect.  The Foley catheter  was removed and the sponge stick was removed.  There were no complications.  ESTIMATED BLOOD LOSS:  10 mL.  INTRAOPERATIVE FLUIDS:  600 mL  URINE OUTPUT:  200 mL  The patient did receive 30 mg Toradol at the end of the procedure and was taken to recovery room in good condition.   SHY D: 09/05/2022 5:53:38 pm T: 09/05/2022 8:23:00 pm  JOB: [id number]/ [id number]

## 2022-09-05 NOTE — Discharge Instructions (Addendum)

## 2022-09-06 ENCOUNTER — Encounter: Payer: Self-pay | Admitting: Obstetrics and Gynecology

## 2023-03-21 ENCOUNTER — Emergency Department
Admission: EM | Admit: 2023-03-21 | Discharge: 2023-03-21 | Disposition: A | Discharge status: Home / Self Care | Payer: Managed Care, Other (non HMO) | Attending: Emergency Medicine | Admitting: Emergency Medicine

## 2023-03-21 ENCOUNTER — Other Ambulatory Visit: Payer: Self-pay

## 2023-03-21 DIAGNOSIS — J020 Streptococcal pharyngitis: Secondary | ICD-10-CM | POA: Diagnosis not present

## 2023-03-21 DIAGNOSIS — R1310 Dysphagia, unspecified: Secondary | ICD-10-CM | POA: Diagnosis present

## 2023-03-21 DIAGNOSIS — Z20822 Contact with and (suspected) exposure to covid-19: Secondary | ICD-10-CM | POA: Insufficient documentation

## 2023-03-21 DIAGNOSIS — J029 Acute pharyngitis, unspecified: Secondary | ICD-10-CM

## 2023-03-21 LAB — RESP PANEL BY RT-PCR (RSV, FLU A&B, COVID)  RVPGX2
Influenza A by PCR: NEGATIVE
Influenza B by PCR: NEGATIVE
Resp Syncytial Virus by PCR: NEGATIVE
SARS Coronavirus 2 by RT PCR: NEGATIVE

## 2023-03-21 LAB — GROUP A STREP BY PCR: Group A Strep by PCR: DETECTED — AB

## 2023-03-21 MED ORDER — ACETAMINOPHEN 325 MG PO TABS
650.0000 mg | ORAL_TABLET | Freq: Once | ORAL | Status: AC
  Administered 2023-03-21: 650 mg via ORAL
  Filled 2023-03-21: qty 2

## 2023-03-21 MED ORDER — AMOXICILLIN-POT CLAVULANATE 875-125 MG PO TABS: 1.0000 | ORAL_TABLET | Freq: Two times a day (BID) | ORAL | 0 refills | Status: DC

## 2023-03-21 MED ORDER — AMOXICILLIN-POT CLAVULANATE 875-125 MG PO TABS: 1.0000 | ORAL_TABLET | Freq: Two times a day (BID) | ORAL | 0 refills | Status: AC

## 2023-03-21 NOTE — ED Provider Notes (Signed)
 [facility] Provider Note    Event Date/Time   First MD Initiated Contact with Patient 03/21/23 1404     (approximate)   History   Sore Throat   HPI  Nichole Bowen is a 38 y.o. female who presents today for 3 days of sore throat.  Patient denies fevers or chills.  She reports that she is able to swallow but it hurts to swallow.  She has not had any drooling or difficulty handling her secretions.  Denies cough.  There are no active problems to display for this patient.         Physical Exam   Triage Vital Signs: ED Triage Vitals  Encounter Vitals Group     BP 03/21/23 1353 114/78     Systolic BP Percentile --      Diastolic BP Percentile --      Pulse Rate 03/21/23 1353 67     Resp 03/21/23 1353 16     Temp 03/21/23 1353 98.5 F (36.9 C)     Temp Source 03/21/23 1353 Oral     SpO2 03/21/23 1353 100 %     Weight 03/21/23 1354 155 lb (70.3 kg)     Height 03/21/23 1354 5' 4 (1.626 m)     Head Circumference --      Peak Flow --      Pain Score 03/21/23 1358 5     Pain Loc --      Pain Education --      Exclude from Growth Chart --     Most recent vital signs: Vitals:   03/21/23 1353  BP: 114/78  Pulse: 67  Resp: 16  Temp: 98.5 F (36.9 C)  SpO2: 100%    Physical Exam Vitals and nursing note reviewed.  Constitutional:      General: Awake and alert. No acute distress.    Appearance: Normal appearance. The patient is normal weight.  HENT:     Head: Normocephalic and atraumatic.     Mouth: Mucous membranes are moist. Uvula midline.  Bilateral tonsillar exudate.  No soft palate fluctuance.  No trismus.  No voice change.  No sublingual swelling.  Mild tender cervical lymphadenopathy.  No nuchal rigidity Eyes:     General: PERRL. Normal EOMs        Right eye: No discharge.        Left eye: No discharge.     Conjunctiva/sclera: Conjunctivae normal.  Cardiovascular:     Rate and Rhythm: Normal rate and regular rhythm.      Pulses: Normal pulses.     Heart sounds: Normal heart sounds Pulmonary:     Effort: Pulmonary effort is normal. No respiratory distress.     Breath sounds: Normal breath sounds.  Abdominal:     Abdomen is soft. There is no abdominal tenderness. No rebound or guarding. No distention. Musculoskeletal:        General: No swelling. Normal range of motion.     Cervical back: Normal range of motion and neck supple.  Skin:    General: Skin is warm and dry.     Capillary Refill: Capillary refill takes less than 2 seconds.     Findings: No rash.  Neurological:     Mental Status: The patient is awake and alert.      ED Results / Procedures / Treatments   Labs (all labs ordered are listed, but only abnormal results are displayed) Labs Reviewed  GROUP A STREP BY PCR -  Abnormal; Notable for the following components:      Result Value   Group A Strep by PCR DETECTED (*)    All other components within normal limits  RESP PANEL BY RT-PCR (RSV, FLU A&B, COVID)  RVPGX2     EKG     RADIOLOGY     PROCEDURES:  Critical Care performed:   Procedures   MEDICATIONS ORDERED IN ED: Medications  acetaminophen  (TYLENOL ) tablet 650 mg (650 mg Oral Given 03/21/23 1424)     IMPRESSION / MDM / ASSESSMENT AND PLAN / ED COURSE  I reviewed the triage vital signs and the nursing notes.   Differential diagnosis includes, but is not limited to, strep, COVID, flu, RSV, other viral etiology.  Patient is awake and alert, hemodynamically stable and afebrile.  She is nontoxic in appearance.  She has tonsillar exudate bilaterally, though uvula is midline, no trismus, no drooling, no nuchal rigidity, no neck pain or stiffness, I do not suspect peritonsillar or retropharyngeal abscess.  COVID, flu, RSV and strep swabs  obtained in triage.  She was treated symptomatically with Tylenol .  Strep swab is positive. Patient was started on antibiotics and we discussed the importance of taking the full 10 days  of treatment in order to prevent post streptococcal complications.  Given that she has recurrent strep infections, she was given the information for ENT to establish a follow-up appointment to discuss tonsillectomy if she desires.  I discussed with the patient who is in agreement.  We discussed return precautions and outpatient follow-up.  Patient understands and agrees with plan.  She was discharged in stable condition.   Patient's presentation is most consistent with acute complicated illness / injury requiring diagnostic workup.      FINAL CLINICAL IMPRESSION(S) / ED DIAGNOSES   Final diagnoses:  Sore throat  Strep throat     Rx / DC Orders   ED Discharge Orders          Ordered    amoxicillin -clavulanate (AUGMENTIN ) 875-125 MG tablet  2 times daily,   Status:  Discontinued        03/21/23 1448    amoxicillin -clavulanate (AUGMENTIN ) 875-125 MG tablet  2 times daily        03/21/23 1453             Note:  This document was prepared using Dragon voice recognition software and may include unintentional dictation errors.   Shahmeer Bunn E, PA-C 03/21/23 1455    Suzanne Kirsch, MD 03/21/23 [phone]

## 2023-03-21 NOTE — ED Triage Notes (Signed)
 Pt to ED for sore throat for a few days

## 2023-03-21 NOTE — Discharge Instructions (Addendum)
 Please take the antibiotics as prescribed.  Please follow-up with ENT for further workup and management and due to recurrent tonsillitis.  Return for any new, worsening, or change in symptoms or other concerns.  It was a pleasure caring for you today.

## 2023-07-23 ENCOUNTER — Emergency Department
Admission: EM | Admit: 2023-07-23 | Discharge: 2023-07-23 | Disposition: A | Discharge status: Home / Self Care | Attending: Emergency Medicine | Admitting: Emergency Medicine

## 2023-07-23 ENCOUNTER — Encounter: Payer: Self-pay | Admitting: Emergency Medicine

## 2023-07-23 ENCOUNTER — Other Ambulatory Visit: Payer: Self-pay

## 2023-07-23 DIAGNOSIS — R519 Headache, unspecified: Secondary | ICD-10-CM | POA: Insufficient documentation

## 2023-07-23 DIAGNOSIS — N3001 Acute cystitis with hematuria: Secondary | ICD-10-CM | POA: Diagnosis not present

## 2023-07-23 DIAGNOSIS — R509 Fever, unspecified: Secondary | ICD-10-CM | POA: Diagnosis present

## 2023-07-23 LAB — URINALYSIS, W/ REFLEX TO CULTURE (INFECTION SUSPECTED)
Bilirubin Urine: NEGATIVE
Glucose, UA: NEGATIVE mg/dL
Ketones, ur: 5 mg/dL — AB
Nitrite: NEGATIVE
Protein, ur: 30 mg/dL — AB
Specific Gravity, Urine: 1.021 (ref 1.005–1.030)
pH: 5 (ref 5.0–8.0)

## 2023-07-23 LAB — RESP PANEL BY RT-PCR (RSV, FLU A&B, COVID)  RVPGX2
Influenza A by PCR: NEGATIVE
Influenza B by PCR: NEGATIVE
Resp Syncytial Virus by PCR: NEGATIVE
SARS Coronavirus 2 by RT PCR: NEGATIVE

## 2023-07-23 LAB — COMPREHENSIVE METABOLIC PANEL WITH GFR
ALT: 13 U/L (ref 0–44)
AST: 16 U/L (ref 15–41)
Albumin: 3.8 g/dL (ref 3.5–5.0)
Alkaline Phosphatase: 47 U/L (ref 38–126)
Anion gap: 12 (ref 5–15)
BUN: 9 mg/dL (ref 6–20)
CO2: 23 mmol/L (ref 22–32)
Calcium: 9 mg/dL (ref 8.9–10.3)
Chloride: 100 mmol/L (ref 98–111)
Creatinine, Ser: 0.7 mg/dL (ref 0.44–1.00)
GFR, Estimated: >60 mL/min (ref >60)
Glucose, Bld: 114 mg/dL — ABNORMAL HIGH (ref 70–99)
Potassium: 3.6 mmol/L (ref 3.5–5.1)
Sodium: 135 mmol/L (ref 135–145)
Total Bilirubin: 0.7 mg/dL (ref 0.0–1.2)
Total Protein: 8.7 g/dL — ABNORMAL HIGH (ref 6.5–8.1)

## 2023-07-23 LAB — CBC WITH DIFFERENTIAL/PLATELET
Abs Immature Granulocytes: 0.04 10*3/uL (ref 0.00–0.07)
Basophils Absolute: 0 10*3/uL (ref 0.0–0.1)
Basophils Relative: 0 %
Eosinophils Absolute: 0 10*3/uL (ref 0.0–0.5)
Eosinophils Relative: 0 %
HCT: 38.7 % (ref 36.0–46.0)
Hemoglobin: 12.8 g/dL (ref 12.0–15.0)
Immature Granulocytes: 0 %
Lymphocytes Relative: 9 %
Lymphs Abs: 1.1 10*3/uL (ref 0.7–4.0)
MCH: 28 pg (ref 26.0–34.0)
MCHC: 33.1 g/dL (ref 30.0–36.0)
MCV: 84.7 fL (ref 80.0–100.0)
Monocytes Absolute: 0.9 10*3/uL (ref 0.1–1.0)
Monocytes Relative: 7 %
Neutro Abs: 9.9 10*3/uL — ABNORMAL HIGH (ref 1.7–7.7)
Neutrophils Relative %: 84 %
Platelets: 389 10*3/uL (ref 150–400)
RBC: 4.57 MIL/uL (ref 3.87–5.11)
RDW: 12.9 % (ref 11.5–15.5)
WBC: 12 10*3/uL — ABNORMAL HIGH (ref 4.0–10.5)
nRBC: 0 % (ref 0.0–0.2)

## 2023-07-23 LAB — LACTIC ACID, PLASMA: Lactic Acid, Venous: 1.1 mmol/L (ref 0.5–1.9)

## 2023-07-23 LAB — GROUP A STREP BY PCR: Group A Strep by PCR: NOT DETECTED

## 2023-07-23 MED ORDER — ACETAMINOPHEN 325 MG PO TABS
650.0000 mg | ORAL_TABLET | Freq: Once | ORAL | Status: AC | PRN
  Administered 2023-07-23: 650 mg via ORAL
  Filled 2023-07-23: qty 2

## 2023-07-23 MED ORDER — IBUPROFEN 800 MG PO TABS
800.0000 mg | ORAL_TABLET | Freq: Once | ORAL | Status: AC
  Administered 2023-07-23: 800 mg via ORAL
  Filled 2023-07-23: qty 1

## 2023-07-23 MED ORDER — SULFAMETHOXAZOLE-TRIMETHOPRIM 800-160 MG PO TABS: 1.0000 | ORAL_TABLET | Freq: Two times a day (BID) | ORAL | 0 refills | Status: AC

## 2023-07-23 MED ORDER — SODIUM CHLORIDE 0.9 % IV SOLN
1.0000 g | Freq: Once | INTRAVENOUS | Status: AC
  Administered 2023-07-23: 1 g via INTRAVENOUS
  Filled 2023-07-23: qty 10

## 2023-07-23 MED ORDER — SODIUM CHLORIDE 0.9 % IV BOLUS
1000.0000 mL | Freq: Once | INTRAVENOUS | Status: AC
  Administered 2023-07-23: 1000 mL via INTRAVENOUS

## 2023-07-23 MED ORDER — ONDANSETRON HCL 4 MG/2ML IJ SOLN
4.0000 mg | Freq: Once | INTRAMUSCULAR | Status: AC
  Administered 2023-07-23: 4 mg via INTRAVENOUS
  Filled 2023-07-23: qty 2

## 2023-07-23 NOTE — Discharge Instructions (Signed)
 Please take antibiotics as prescribed.  Make sure you are drinking lots of fluids.  Return to the ER for any fevers above 101 that are not going down with Tylenol  and ibuprofen.  Return to the ER for any severe back pain, vomiting or any urgent changes in your health.

## 2023-07-23 NOTE — ED Triage Notes (Addendum)
 Patient to ED via POV for fever- 104 at home. PT states she has a headache, cold chills and lack of appetite for the past few days. Denies sick contact. Pt states she has not taken fever meds today. Having nausea and vomiting.   1 set of blood cultures obtained at this time.

## 2023-07-23 NOTE — ED Provider Notes (Signed)
 [place] EMERGENCY DEPARTMENT AT [facility] Provider Note   CSN: [id number] Arrival date & time: 07/23/23  1722     History  Chief Complaint  Patient presents with   Fever    Nichole Bowen is a 38 y.o. female with past medical history of of uterine fibroid with hysterectomy performed in 2024 presents for evaluation of fever.  She has had a fever and bodyaches for 6 days.  Over the last 6 days has had 3 episodes of vomiting most recently last night.  She has taken some Tylenol  and ibuprofen.  Has had a headache, no neck stiffness.  Patient denies any abdominal pain chest pain shortness of breath, cough, congestion, runny nose, sore throat, rashes, tick exposure, tick bites.  She has not traveled outside the country.  All vaccinations are up-to-date.  HPI     Home Medications Prior to Admission medications   Medication Sig Start Date End Date Taking? Authorizing Provider  sulfamethoxazole-trimethoprim (BACTRIM DS) 800-160 MG tablet Take 1 tablet by mouth 2 (two) times daily for 10 days. 07/23/23 08/02/23 Yes Coralyn Derry, PA-C      Allergies    Aspirin and Tomato    Review of Systems   Review of Systems  Physical Exam Updated Vital Signs BP 103/76   Pulse 87   Temp 98 F (36.7 C)   Resp 18   Ht 5\' 4"  (1.626 m)   Wt 59.4 kg   LMP 04/13/2022 (Approximate)   SpO2 97%   BMI 22.49 kg/m  Physical Exam Constitutional:      Appearance: She is well-developed.  HENT:     Head: Normocephalic and atraumatic.     Right Ear: Tympanic membrane and external ear normal.     Left Ear: Tympanic membrane and external ear normal.     Nose: Nose normal.     Mouth/Throat:     Pharynx: No oropharyngeal exudate or posterior oropharyngeal erythema.  Eyes:     Extraocular Movements: Extraocular movements intact.     Conjunctiva/sclera: Conjunctivae normal.     Pupils: Pupils are equal, round, and reactive to light.  Neck:     Comments: Negative Kernig, Brudzinski and  head jolt test.  Patient able to actively and passively touch her chin to her chest. Cardiovascular:     Rate and Rhythm: Normal rate.  Pulmonary:     Effort: Pulmonary effort is normal. No respiratory distress.  Abdominal:     General: There is no distension.     Tenderness: There is no abdominal tenderness. There is no right CVA tenderness, left CVA tenderness or guarding.  Musculoskeletal:        General: Normal range of motion.     Cervical back: Normal range of motion and neck supple. No rigidity or tenderness.  Skin:    General: Skin is warm.     Findings: No rash.  Neurological:     General: No focal deficit present.     Mental Status: She is alert and oriented to person, place, and time. Mental status is at baseline.     Cranial Nerves: No cranial nerve deficit.     Motor: No weakness.     Gait: Gait normal.  Psychiatric:        Mood and Affect: Mood normal.        Behavior: Behavior normal.        Thought Content: Thought content normal.     ED Results / Procedures / Treatments  Labs (all labs ordered are listed, but only abnormal results are displayed) Labs Reviewed  COMPREHENSIVE METABOLIC PANEL WITH GFR - Abnormal; Notable for the following components:      Result Value   Glucose, Bld 114 (*)    Total Protein 8.7 (*)    All other components within normal limits  CBC WITH DIFFERENTIAL/PLATELET - Abnormal; Notable for the following components:   WBC 12.0 (*)    Neutro Abs 9.9 (*)    All other components within normal limits  URINALYSIS, W/ REFLEX TO CULTURE (INFECTION SUSPECTED) - Abnormal; Notable for the following components:   Color, Urine YELLOW (*)    APPearance HAZY (*)    Hgb urine dipstick LARGE (*)    Ketones, ur 5 (*)    Protein, ur 30 (*)    Leukocytes,Ua TRACE (*)    Bacteria, UA MANY (*)    All other components within normal limits  GROUP A STREP BY PCR  RESP PANEL BY RT-PCR (RSV, FLU A&B, COVID)  RVPGX2  URINE CULTURE  LACTIC ACID, PLASMA   LACTIC ACID, PLASMA    EKG None  Radiology No results found.  Procedures Procedures    Medications Ordered in ED Medications  cefTRIAXone (ROCEPHIN) 1 g in sodium chloride  0.9 % 100 mL IVPB (1 g Intravenous New Bag/Given 07/23/23 2224)  acetaminophen  (TYLENOL ) tablet 650 mg (650 mg Oral Given 07/23/23 1731)  ibuprofen (ADVIL) tablet 800 mg (800 mg Oral Given 07/23/23 1957)  sodium chloride  0.9 % bolus 1,000 mL (0 mLs Intravenous Stopped 07/23/23 2134)  ondansetron  (ZOFRAN ) injection 4 mg (4 mg Intravenous Given 07/23/23 1957)    ED Course/ Medical Decision Making/ A&P                                 Medical Decision Making Amount and/or Complexity of Data Reviewed Labs: ordered.  Risk OTC drugs. Prescription drug management.   38 year old female with fevers.  She has had bodyaches and a few episodes of vomiting over the last 6 days.  Initially came in febrile but with Tylenol  and ibuprofen fever resolved.  She was given IV fluids and vital signs are stable.  CBC shows slightly elevated white count of 12.0.  Normal lactic acid.  CMP within normal limits.  Patient given IV fluids.  Urinalysis shows positive UTI.  Urine culture obtained and pending.  Patient given 1 g of IV Rocephin.  She was started on Bactrim DS for 10 days.  Patient appears well, asymptomatic at discharge.  She is given strict return precautions understands signs and symptoms return to the ED for.     final Clinical Impression(s) / ED Diagnoses Final diagnoses:  Fever, unspecified fever cause  Acute cystitis with hematuria    Rx / DC Orders ED Discharge Orders          Ordered    sulfamethoxazole-trimethoprim (BACTRIM DS) 800-160 MG tablet  2 times daily        07/23/23 2209              Alyxander Kollmann C, PA-C 07/23/23 2232    Shane Darling, MD 07/23/23 2241

## 2023-07-26 LAB — URINE CULTURE: Culture: >=100000 — AB

## 2023-07-27 NOTE — Progress Notes (Signed)
 ED Antimicrobial Stewardship Positive Culture Follow Up   Nichole Bowen is an 38 y.o. female who presented to [facility] on 07/23/2023 with a chief complaint of  Chief Complaint  Patient presents with   Fever    Recent Results (from the past 720 hours)  Group A Strep by PCR     Status: None   Collection Time: 07/23/23  7:54 PM   Specimen: Anterior Nasal Swab; Sterile Swab  Result Value Ref Range Status   Group A Strep by PCR NOT DETECTED NOT DETECTED Final    Comment: Performed at [facility], [address] [place], [place] [address]  Resp panel by RT-PCR (RSV, Flu A&B, Covid) Anterior Nasal Swab     Status: None   Collection Time: 07/23/23  7:54 PM   Specimen: Anterior Nasal Swab  Result Value Ref Range Status   SARS Coronavirus 2 by RT PCR NEGATIVE NEGATIVE Final    Comment: (NOTE) SARS-CoV-2 target nucleic acids are NOT DETECTED.  The SARS-CoV-2 RNA is generally detectable in upper respiratory specimens during the acute phase of infection. The lowest concentration of SARS-CoV-2 viral copies this assay can detect is 138 copies/mL. A negative result does not preclude SARS-Cov-2 infection and should not be used as the sole basis for treatment or other patient management decisions. A negative result may occur with  improper specimen collection/handling, submission of specimen other than nasopharyngeal swab, presence of viral mutation(s) within the areas targeted by this assay, and inadequate number of viral copies(<138 copies/mL). A negative result must be combined with clinical observations, patient history, and epidemiological information. The expected result is Negative.  Fact Sheet for Patients:  BloggerCourse.com  Fact Sheet for Healthcare Providers:  SeriousBroker.it  This test is no t yet approved or cleared by the [place]  FDA and  has been authorized for detection and/or diagnosis of SARS-CoV-2  by FDA under an Emergency Use Authorization (EUA). This EUA will remain  in effect (meaning this test can be used) for the duration of the COVID-19 declaration under Section 564(b)(1) of the Act, 21 U.S.C.section 360bbb-3(b)(1), unless the authorization is terminated  or revoked sooner.       Influenza A by PCR NEGATIVE NEGATIVE Final   Influenza B by PCR NEGATIVE NEGATIVE Final    Comment: (NOTE) The Xpert Xpress SARS-CoV-2/FLU/RSV plus assay is intended as an aid in the diagnosis of influenza from Nasopharyngeal swab specimens and should not be used as a sole basis for treatment. Nasal washings and aspirates are unacceptable for Xpert Xpress SARS-CoV-2/FLU/RSV testing.  Fact Sheet for Patients: BloggerCourse.com  Fact Sheet for Healthcare Providers: SeriousBroker.it  This test is not yet approved or cleared by the [place]  FDA and has been authorized for detection and/or diagnosis of SARS-CoV-2 by FDA under an Emergency Use Authorization (EUA). This EUA will remain in effect (meaning this test can be used) for the duration of the COVID-19 declaration under Section 564(b)(1) of the Act, 21 U.S.C. section 360bbb-3(b)(1), unless the authorization is terminated or revoked.     Resp Syncytial Virus by PCR NEGATIVE NEGATIVE Final    Comment: (NOTE) Fact Sheet for Patients: BloggerCourse.com  Fact Sheet for Healthcare Providers: SeriousBroker.it  This test is not yet approved or cleared by the [place]  FDA and has been authorized for detection and/or diagnosis of SARS-CoV-2 by FDA under an Emergency Use Authorization (EUA). This EUA will remain in effect (meaning this test can be used) for the duration of the COVID-19 declaration under  Section 564(b)(1) of the Act, 21 U.S.C. section 360bbb-3(b)(1), unless the authorization is terminated or revoked.  Performed at  [facility], [address], [place], [place] [address]   Urine Culture     Status: Abnormal   Collection Time: 07/23/23  8:02 PM   Specimen: Urine, Random  Result Value Ref Range Status   Specimen Description   Final    URINE, RANDOM Performed at [facility], [address] [place], [place] [address]    Special Requests   Final    NONE Reflexed from [id number] Performed at [facility], [address] [place], [place] [address]    Culture >=100,000 COLONIES/mL ESCHERICHIA COLI (A)  Final   Report Status 07/26/2023 FINAL  Final   Organism ID, Bacteria ESCHERICHIA COLI (A)  Final      Susceptibility   Escherichia coli - MIC*    AMPICILLIN <=2 SENSITIVE Sensitive     CEFAZOLIN  <=4 SENSITIVE Sensitive     CEFEPIME <=0.12 SENSITIVE Sensitive     CEFTRIAXONE  <=0.25 SENSITIVE Sensitive     CIPROFLOXACIN <=0.25 SENSITIVE Sensitive     GENTAMICIN <=1 SENSITIVE Sensitive     IMIPENEM <=0.25 SENSITIVE Sensitive     NITROFURANTOIN 128 RESISTANT Resistant     TRIMETH /SULFA  >=320 RESISTANT Resistant     AMPICILLIN/SULBACTAM <=2 SENSITIVE Sensitive     PIP/TAZO <=4 SENSITIVE Sensitive ug/mL    * >=100,000 COLONIES/mL ESCHERICHIA COLI    [x]  Treated with Bactrim , organism resistant to prescribed antimicrobial []  Patient discharged originally without antimicrobial agent and treatment is now indicated  New antibiotic prescription: N/A  ED Provider: Siadecki  Called patient to discuss how she was feeling and if she was still having any UTI symptoms. Patient stated she never really felt like she was having a UTI(has had multiple in the past) and just more of a fever of unknow origin. Was discharged on tylenol , ibuprofen , and bactrim . Patient still will take tylenol  and ibuprofen  to help with fevers, but no urinary symptoms seen. Spoke with EDP, no need to escalate therapy. Patient aware that if fever persists, will go to PCP at [facility].   Kennette Cuthrell A Tayna Smethurst  ,PHARMD Clinical Pharmacist  07/27/2023, 12:08 PM
